# Patient Record
Sex: Female | Born: 1965 | Race: White | Hispanic: No | Marital: Single | Smoking: Former smoker
Health system: Southern US, Community
[De-identification: ages and names within clinical notes are randomized; demographics above are authoritative.]

## PROBLEM LIST (undated history)

## (undated) HISTORY — PX: BUNIONECTOMY: SHX129

---

## 2004-11-02 ENCOUNTER — Ambulatory Visit: Payer: Self-pay | Admitting: Internal Medicine

## 2004-12-30 ENCOUNTER — Ambulatory Visit: Payer: Self-pay | Admitting: Internal Medicine

## 2005-06-24 ENCOUNTER — Ambulatory Visit: Payer: Self-pay | Admitting: Internal Medicine

## 2006-03-21 ENCOUNTER — Ambulatory Visit: Payer: Self-pay | Admitting: Family Medicine

## 2006-10-11 ENCOUNTER — Ambulatory Visit: Payer: Self-pay | Admitting: Internal Medicine

## 2007-06-27 ENCOUNTER — Ambulatory Visit: Payer: Self-pay | Admitting: Internal Medicine

## 2007-06-27 DIAGNOSIS — L509 Urticaria, unspecified: Secondary | ICD-10-CM | POA: Insufficient documentation

## 2007-06-27 DIAGNOSIS — J029 Acute pharyngitis, unspecified: Secondary | ICD-10-CM | POA: Insufficient documentation

## 2007-06-27 LAB — CONVERTED CEMR LAB: Rapid Strep: NEGATIVE

## 2007-07-04 ENCOUNTER — Ambulatory Visit: Payer: Self-pay | Admitting: Internal Medicine

## 2007-09-25 ENCOUNTER — Ambulatory Visit: Payer: Self-pay | Admitting: Internal Medicine

## 2007-09-25 LAB — CONVERTED CEMR LAB
AST: 19 units/L (ref 0–37)
Bilirubin, Direct: 0.2 mg/dL (ref 0.0–0.3)
CO2: 28 meq/L (ref 19–32)
Chloride: 107 meq/L (ref 96–112)
Creatinine, Ser: 0.9 mg/dL (ref 0.4–1.2)
Eosinophils Absolute: 0.1 10*3/uL (ref 0.0–0.6)
Eosinophils Relative: 1.6 % (ref 0.0–5.0)
GFR calc non Af Amer: 74 mL/min
Glucose, Bld: 87 mg/dL (ref 70–99)
Glucose, Urine, Semiquant: NEGATIVE
HCT: 38.5 % (ref 36.0–46.0)
Lymphocytes Relative: 33.9 % (ref 12.0–46.0)
MCV: 94.7 fL (ref 78.0–100.0)
Neutrophils Relative %: 55.9 % (ref 43.0–77.0)
RBC: 4.07 M/uL (ref 3.87–5.11)
Sodium: 140 meq/L (ref 135–145)
Specific Gravity, Urine: 1.025
TSH: 1.34 microintl units/mL (ref 0.35–5.50)
Total Bilirubin: 0.7 mg/dL (ref 0.3–1.2)
Total CHOL/HDL Ratio: 2.8
Total Protein: 6.2 g/dL (ref 6.0–8.3)
WBC Urine, dipstick: NEGATIVE
WBC: 5.4 10*3/uL (ref 4.5–10.5)

## 2007-10-02 ENCOUNTER — Ambulatory Visit: Payer: Self-pay | Admitting: Internal Medicine

## 2007-10-02 DIAGNOSIS — K279 Peptic ulcer, site unspecified, unspecified as acute or chronic, without hemorrhage or perforation: Secondary | ICD-10-CM | POA: Insufficient documentation

## 2007-12-11 ENCOUNTER — Encounter: Admission: RE | Admit: 2007-12-11 | Discharge: 2007-12-11 | Payer: Self-pay | Admitting: Obstetrics and Gynecology

## 2007-12-20 ENCOUNTER — Encounter: Admission: RE | Admit: 2007-12-20 | Discharge: 2007-12-20 | Payer: Self-pay | Admitting: Obstetrics and Gynecology

## 2008-12-31 ENCOUNTER — Encounter: Payer: Self-pay | Admitting: Internal Medicine

## 2009-01-30 ENCOUNTER — Ambulatory Visit: Payer: Self-pay | Admitting: Internal Medicine

## 2009-01-30 DIAGNOSIS — J069 Acute upper respiratory infection, unspecified: Secondary | ICD-10-CM | POA: Insufficient documentation

## 2010-07-13 ENCOUNTER — Encounter: Admission: RE | Admit: 2010-07-13 | Discharge: 2010-07-13 | Payer: Self-pay | Admitting: Obstetrics and Gynecology

## 2010-09-06 ENCOUNTER — Encounter: Payer: Self-pay | Admitting: Obstetrics and Gynecology

## 2011-06-28 ENCOUNTER — Other Ambulatory Visit: Payer: Self-pay | Admitting: Obstetrics and Gynecology

## 2011-06-28 DIAGNOSIS — Z1231 Encounter for screening mammogram for malignant neoplasm of breast: Secondary | ICD-10-CM

## 2011-07-23 ENCOUNTER — Ambulatory Visit
Admission: RE | Admit: 2011-07-23 | Discharge: 2011-07-23 | Disposition: A | Discharge status: Home / Self Care | Payer: BC Managed Care – PPO | Source: Ambulatory Visit | Attending: Obstetrics and Gynecology | Admitting: Obstetrics and Gynecology

## 2011-07-23 DIAGNOSIS — Z1231 Encounter for screening mammogram for malignant neoplasm of breast: Secondary | ICD-10-CM

## 2012-12-19 ENCOUNTER — Ambulatory Visit (INDEPENDENT_AMBULATORY_CARE_PROVIDER_SITE_OTHER): Payer: BC Managed Care – PPO | Admitting: Internal Medicine

## 2012-12-19 ENCOUNTER — Encounter: Payer: Self-pay | Admitting: Internal Medicine

## 2012-12-19 VITALS — BP 120/74 | HR 55 | Temp 97.8°F | Resp 18 | Wt 161.0 lb

## 2012-12-19 DIAGNOSIS — J069 Acute upper respiratory infection, unspecified: Secondary | ICD-10-CM

## 2012-12-19 DIAGNOSIS — J029 Acute pharyngitis, unspecified: Secondary | ICD-10-CM

## 2012-12-19 MED ORDER — HYDROCODONE-HOMATROPINE 5-1.5 MG/5ML PO SYRP: 5.0000 mL | ORAL_SOLUTION | Freq: Four times a day (QID) | ORAL | Status: AC | PRN

## 2012-12-19 NOTE — Progress Notes (Signed)
  Subjective:    Patient ID: Courtney Schultz, female    DOB: 09/17/1965, 47 y.o.   MRN: [id number]  HPI  47 year old patient who presents with a one-week history of sore throat chest congestion and cough. No fever;  denies any wheezing shortness of breath or significant sputum production.  She enjoys excellent health and takes no chronic medications.  History reviewed. No pertinent past medical history.  History   Social History  . Marital Status: Single    Spouse Name: N/A    Number of Children: N/A  . Years of Education: N/A   Occupational History  . Not on file.   Social History Main Topics  . Smoking status: Former Smoker    Types: Cigarettes  . Smokeless tobacco: Never Used  . Alcohol Use: 4.2 oz/week    7 Cans of beer per week  . Drug Use: No  . Sexually Active: Not on file   Other Topics Concern  . Not on file   Social History Narrative  . No narrative on file    History reviewed. No pertinent past surgical history.  No family history on file.  Allergies  Allergen Reactions  . Penicillins     No current outpatient prescriptions on file prior to visit.   No current facility-administered medications on file prior to visit.    BP 120/74  Pulse 55  Temp(Src) 97.8 F (36.6 C) (Oral)  Resp 18  Wt 161 lb (73.029 kg)  BMI 26 kg/m2  SpO2 98%  LMP 12/11/2012       Review of Systems  Constitutional: Negative.   HENT: Positive for congestion, sore throat, rhinorrhea and sinus pressure. Negative for hearing loss, dental problem and tinnitus.   Eyes: Negative for pain, discharge and visual disturbance.  Respiratory: Positive for cough. Negative for shortness of breath.   Cardiovascular: Negative for chest pain, palpitations and leg swelling.  Gastrointestinal: Negative for nausea, vomiting, abdominal pain, diarrhea, constipation, blood in stool and abdominal distention.  Genitourinary: Negative for dysuria, urgency, frequency, hematuria, flank pain,  vaginal bleeding, vaginal discharge, difficulty urinating, vaginal pain and pelvic pain.  Musculoskeletal: Negative for joint swelling, arthralgias and gait problem.  Skin: Negative for rash.  Neurological: Negative for dizziness, syncope, speech difficulty, weakness, numbness and headaches.  Hematological: Negative for adenopathy.  Psychiatric/Behavioral: Negative for behavioral problems, dysphoric mood and agitation. The patient is not nervous/anxious.        Objective:   Physical Exam  Constitutional: She is oriented to person, place, and time. She appears well-developed and well-nourished. No distress.  HENT:  Head: Normocephalic.  Right Ear: External ear normal.  Left Ear: External ear normal.  Erythema of the oropharynx  Eyes: Conjunctivae and EOM are normal. Pupils are equal, round, and reactive to light.  Neck: Normal range of motion. Neck supple. No thyromegaly present.  Cardiovascular: Normal rate, regular rhythm, normal heart sounds and intact distal pulses.   Pulmonary/Chest: Effort normal and breath sounds normal.  Abdominal: Soft. Bowel sounds are normal. She exhibits no mass. There is no tenderness.  Musculoskeletal: Normal range of motion.  Lymphadenopathy:    She has no cervical adenopathy.  Neurological: She is alert and oriented to person, place, and time.  Skin: Skin is warm and dry. No rash noted.  Psychiatric: She has a normal mood and affect. Her behavior is normal.          Assessment & Plan:   Viral URI with cough. We'll treat symptomatically

## 2012-12-19 NOTE — Patient Instructions (Signed)
Acute bronchitis symptoms for less than 10 days are generally not helped by antibiotics.  Take over-the-counter expectorants and cough medications such as  Mucinex DM.  Call if there is no improvement in 5 to 7 days or if he developed worsening cough, fever, or new symptoms, such as shortness of breath or chest pain.    

## 2013-01-30 ENCOUNTER — Encounter: Payer: Self-pay | Admitting: Internal Medicine

## 2013-01-30 ENCOUNTER — Ambulatory Visit (INDEPENDENT_AMBULATORY_CARE_PROVIDER_SITE_OTHER): Payer: BC Managed Care – PPO | Admitting: Internal Medicine

## 2013-01-30 VITALS — BP 102/60 | HR 59 | Temp 98.5°F | Resp 18 | Ht 66.5 in | Wt 162.0 lb

## 2013-01-30 DIAGNOSIS — Z0000 Encounter for general adult medical examination without abnormal findings: Secondary | ICD-10-CM

## 2013-01-30 DIAGNOSIS — F909 Attention-deficit hyperactivity disorder, unspecified type: Secondary | ICD-10-CM

## 2013-01-30 DIAGNOSIS — L509 Urticaria, unspecified: Secondary | ICD-10-CM

## 2013-01-30 LAB — CBC WITH DIFFERENTIAL/PLATELET
Basophils Absolute: 0.1 10*3/uL (ref 0.0–0.1)
Eosinophils Absolute: 0.1 10*3/uL (ref 0.0–0.7)
Lymphocytes Relative: 34.3 % (ref 12.0–46.0)
MCHC: 33.6 g/dL (ref 30.0–36.0)
MCV: 97.2 fl (ref 78.0–100.0)
Monocytes Absolute: 0.4 10*3/uL (ref 0.1–1.0)
Neutro Abs: 3.4 10*3/uL (ref 1.4–7.7)
Neutrophils Relative %: 56.7 % (ref 43.0–77.0)
RDW: 13.6 % (ref 11.5–14.6)

## 2013-01-30 MED ORDER — AMPHETAMINE-DEXTROAMPHET ER 20 MG PO CP24: 20.0000 mg | ORAL_CAPSULE | ORAL | Status: DC

## 2013-01-30 NOTE — Progress Notes (Signed)
Subjective:    Patient ID: Courtney Schultz, female    DOB: [birth year], 47 y.o.   MRN: [id number]  HPI  47 year old patient who is seen today for a wellness exam. She has a history of beef allergy and has seen allergy in the past.  Beef products have triggered urticaria. No concerns or complaints today. No chronic medications  She does have a history of suspected ADHD but has resisted treatment in the past. She states that she feels very distracted and has a difficult time at work and wishes to consider a trial of medication at this time. She has read the book "driven to distraction" and after completion feels she clearly has ADHD. History reviewed. No pertinent past medical history.  History   Social History  . Marital Status: Single    Spouse Name: N/A    Number of Children: N/A  . Years of Education: N/A   Occupational History  . Not on file.   Social History Main Topics  . Smoking status: Former Smoker    Types: Cigarettes  . Smokeless tobacco: Never Used  . Alcohol Use: 4.2 oz/week    7 Cans of beer per week  . Drug Use: No  . Sexually Active: Not on file   Other Topics Concern  . Not on file   Social History Narrative  . No narrative on file    History reviewed. No pertinent past surgical history.  No family history on file.  Allergies  Allergen Reactions  . Penicillins     No current outpatient prescriptions on file prior to visit.   No current facility-administered medications on file prior to visit.    BP 102/60  Pulse 59  Temp(Src) 98.5 F (36.9 C) (Oral)  Resp 18  Ht 5' 6.5" (1.689 m)  Wt 162 lb (73.483 kg)  BMI 25.76 kg/m2  SpO2 99%  LMP 01/08/2013       Review of Systems  Constitutional: Negative.   HENT: Negative for hearing loss, congestion, sore throat, rhinorrhea, dental problem, sinus pressure and tinnitus.   Eyes: Negative for pain, discharge and visual disturbance.  Respiratory: Negative for cough and shortness of breath.    Cardiovascular: Negative for chest pain, palpitations and leg swelling.  Gastrointestinal: Negative for nausea, vomiting, abdominal pain, diarrhea, constipation, blood in stool and abdominal distention.  Genitourinary: Negative for dysuria, urgency, frequency, hematuria, flank pain, vaginal bleeding, vaginal discharge, difficulty urinating, vaginal pain and pelvic pain.  Musculoskeletal: Negative for joint swelling, arthralgias and gait problem.  Skin: Negative for rash.  Neurological: Negative for dizziness, syncope, speech difficulty, weakness, numbness and headaches.  Hematological: Negative for adenopathy.  Psychiatric/Behavioral: Negative for behavioral problems, dysphoric mood and agitation. The patient is not nervous/anxious.        Objective:   Physical Exam  Constitutional: She is oriented to person, place, and time. She appears well-developed and well-nourished.  HENT:  Head: Normocephalic.  Right Ear: External ear normal.  Left Ear: External ear normal.  Mouth/Throat: Oropharynx is clear and moist.  Eyes: Conjunctivae and EOM are normal. Pupils are equal, round, and reactive to light.  Neck: Normal range of motion. Neck supple. No thyromegaly present.  Cardiovascular: Normal rate, regular rhythm, normal heart sounds and intact distal pulses.   Pulmonary/Chest: Effort normal and breath sounds normal.  Abdominal: Soft. Bowel sounds are normal. She exhibits no mass. There is no tenderness.  Musculoskeletal: Normal range of motion.  Lymphadenopathy:    She has no cervical adenopathy.  Neurological:  She is alert and oriented to person, place, and time.  Skin: Skin is warm and dry. No rash noted.  Psychiatric: She has a normal mood and affect. Her behavior is normal.          Assessment & Plan:   Preventive health examination History of beef allergy(positive for galactosidase  Antibody)  Suspected ADHD. We'll give a 30 day  trial of Adderall 20 mg extended release

## 2013-01-30 NOTE — Patient Instructions (Addendum)
It is important that you exercise regularly, at least 20 minutes 3 to 4 times per week.  If you develop chest pain or shortness of breath seek  medical attention.  Call or return to clinic  as neededHealth Maintenance, Females A healthy lifestyle and preventative care can promote health and wellness.  Maintain regular health, dental, and eye exams.  Eat a healthy diet. Foods like vegetables, fruits, whole grains, low-fat dairy products, and lean protein foods contain the nutrients you need without too many calories. Decrease your intake of foods high in solid fats, added sugars, and salt. Get information about a proper diet from your caregiver, if necessary.  Regular physical exercise is one of the most important things you can do for your health. Most adults should get at least 150 minutes of moderate-intensity exercise (any activity that increases your heart rate and causes you to sweat) each week. In addition, most adults need muscle-strengthening exercises on 2 or more days a week.   Maintain a healthy weight. The body mass index (BMI) is a screening tool to identify possible weight problems. It provides an estimate of body fat based on height and weight. Your caregiver can help determine your BMI, and can help you achieve or maintain a healthy weight. For adults 20 years and older:  A BMI below 18.5 is considered underweight.  A BMI of 18.5 to 24.9 is normal.  A BMI of 25 to 29.9 is considered overweight.  A BMI of 30 and above is considered obese.  Maintain normal blood lipids and cholesterol by exercising and minimizing your intake of saturated fat. Eat a balanced diet with plenty of fruits and vegetables. Blood tests for lipids and cholesterol should begin at age 61 and be repeated every 5 years. If your lipid or cholesterol levels are high, you are over 50, or you are a high risk for heart disease, you may need your cholesterol levels checked more frequently.Ongoing high lipid and  cholesterol levels should be treated with medicines if diet and exercise are not effective.  If you smoke, find out from your caregiver how to quit. If you do not use tobacco, do not start.  If you are pregnant, do not drink alcohol. If you are breastfeeding, be very cautious about drinking alcohol. If you are not pregnant and choose to drink alcohol, do not exceed 1 drink per day. One drink is considered to be 12 ounces (355 mL) of beer, 5 ounces (148 mL) of wine, or 1.5 ounces (44 mL) of liquor.  Avoid use of street drugs. Do not share needles with anyone. Ask for help if you need support or instructions about stopping the use of drugs.  High blood pressure causes heart disease and increases the risk of stroke. Blood pressure should be checked at least every 1 to 2 years. Ongoing high blood pressure should be treated with medicines, if weight loss and exercise are not effective.  If you are 82 to 46 years old, ask your caregiver if you should take aspirin to prevent strokes.  Diabetes screening involves taking a blood sample to check your fasting blood sugar level. This should be done once every 3 years, after age 56, if you are within normal weight and without risk factors for diabetes. Testing should be considered at a younger age or be carried out more frequently if you are overweight and have at least 1 risk factor for diabetes.  Breast cancer screening is essential preventative care for women. You should practice "  breast self-awareness." This means understanding the normal appearance and feel of your breasts and may include breast self-examination. Any changes detected, no matter how small, should be reported to a caregiver. Women in their 52s and 30s should have a clinical breast exam (CBE) by a caregiver as part of a regular health exam every 1 to 3 years. After age 1, women should have a CBE every year. Starting at age 55, women should consider having a mammogram (breast X-ray) every year.  Women who have a family history of breast cancer should talk to their caregiver about genetic screening. Women at a high risk of breast cancer should talk to their caregiver about having an MRI and a mammogram every year.  The Pap test is a screening test for cervical cancer. Women should have a Pap test starting at age 97. Between ages 6 and 16, Pap tests should be repeated every 2 years. Beginning at age 66, you should have a Pap test every 3 years as long as the past 3 Pap tests have been normal. If you had a hysterectomy for a problem that was not cancer or a condition that could lead to cancer, then you no longer need Pap tests. If you are between ages 42 and 17, and you have had normal Pap tests going back 10 years, you no longer need Pap tests. If you have had past treatment for cervical cancer or a condition that could lead to cancer, you need Pap tests and screening for cancer for at least 20 years after your treatment. If Pap tests have been discontinued, risk factors (such as a new sexual partner) need to be reassessed to determine if screening should be resumed. Some women have medical problems that increase the chance of getting cervical cancer. In these cases, your caregiver may recommend more frequent screening and Pap tests.  The human papillomavirus (HPV) test is an additional test that may be used for cervical cancer screening. The HPV test looks for the virus that can cause the cell changes on the cervix. The cells collected during the Pap test can be tested for HPV. The HPV test could be used to screen women aged 22 years and older, and should be used in women of any age who have unclear Pap test results. After the age of 72, women should have HPV testing at the same frequency as a Pap test.  Colorectal cancer can be detected and often prevented. Most routine colorectal cancer screening begins at the age of 49 and continues through age 37. However, your caregiver may recommend screening at  an earlier age if you have risk factors for colon cancer. On a yearly basis, your caregiver may provide home test kits to check for hidden blood in the stool. Use of a small camera at the end of a tube, to directly examine the colon (sigmoidoscopy or colonoscopy), can detect the earliest forms of colorectal cancer. Talk to your caregiver about this at age 40, when routine screening begins. Direct examination of the colon should be repeated every 5 to 10 years through age 63, unless early forms of pre-cancerous polyps or small growths are found.  Hepatitis C blood testing is recommended for all people born from [birth year] through 1964 and any individual with known risks for hepatitis C.  Practice safe sex. Use condoms and avoid high-risk sexual practices to reduce the spread of sexually transmitted infections (STIs). Sexually active women aged 64 and younger should be checked for Chlamydia, which is a common  sexually transmitted infection. Older women with new or multiple partners should also be tested for Chlamydia. Testing for other STIs is recommended if you are sexually active and at increased risk.  Osteoporosis is a disease in which the bones lose minerals and strength with aging. This can result in serious bone fractures. The risk of osteoporosis can be identified using a bone density scan. Women ages 30 and over and women at risk for fractures or osteoporosis should discuss screening with their caregivers. Ask your caregiver whether you should be taking a calcium supplement or vitamin D to reduce the rate of osteoporosis.  Menopause can be associated with physical symptoms and risks. Hormone replacement therapy is available to decrease symptoms and risks. You should talk to your caregiver about whether hormone replacement therapy is right for you.  Use sunscreen with a sun protection factor (SPF) of 30 or greater. Apply sunscreen liberally and repeatedly throughout the day. You should seek shade when your  shadow is shorter than you. Protect yourself by wearing long sleeves, pants, a wide-brimmed hat, and sunglasses year round, whenever you are outdoors.  Notify your caregiver of new moles or changes in moles, especially if there is a change in shape or color. Also notify your caregiver if a mole is larger than the size of a pencil eraser.  Stay current with your immunizations. Document Released: 02/15/2011 Document Revised: 10/25/2011 Document Reviewed: 02/15/2011 [facility] Patient Information 2014 [place], [place].

## 2013-01-31 LAB — LIPID PANEL
LDL Cholesterol: 96 mg/dL (ref 0–99)
Total CHOL/HDL Ratio: 2

## 2013-01-31 LAB — COMPREHENSIVE METABOLIC PANEL
ALT: 17 U/L (ref 0–35)
AST: 24 U/L (ref 0–37)
Albumin: 3.8 g/dL (ref 3.5–5.2)
Alkaline Phosphatase: 42 U/L (ref 39–117)
Calcium: 9.3 mg/dL (ref 8.4–10.5)
Chloride: 109 mEq/L (ref 96–112)
Potassium: 4.7 mEq/L (ref 3.5–5.1)
Sodium: 140 mEq/L (ref 135–145)

## 2013-03-05 ENCOUNTER — Other Ambulatory Visit: Payer: Self-pay | Admitting: Internal Medicine

## 2013-03-05 NOTE — Telephone Encounter (Signed)
Left message on voicemail to call office.  

## 2013-03-09 NOTE — Telephone Encounter (Signed)
Spoke to pt told her I can give her Rx for Adderall but she has to pick it up here at the office I can not send to pharmacy. Pt verbalized understanding and stated she can come by on Monday. Told pt okay will have ready on Monday.

## 2013-03-12 MED ORDER — AMPHETAMINE-DEXTROAMPHET ER 20 MG PO CP24: 20.0000 mg | ORAL_CAPSULE | ORAL | Status: DC

## 2013-03-12 NOTE — Telephone Encounter (Signed)
Rx's for Adderall printed and signed, put at front desk for pick up.

## 2013-06-21 ENCOUNTER — Other Ambulatory Visit: Payer: Self-pay

## 2013-08-06 ENCOUNTER — Other Ambulatory Visit: Payer: Self-pay | Admitting: Internal Medicine

## 2013-08-14 MED ORDER — AMPHETAMINE-DEXTROAMPHET ER 20 MG PO CP24: 20.0000 mg | ORAL_CAPSULE | ORAL | Status: DC

## 2013-08-14 NOTE — Telephone Encounter (Signed)
Left detailed message Rx's ready for pickup. Rx's printed and signed put at front desk.

## 2014-01-15 ENCOUNTER — Other Ambulatory Visit (HOSPITAL_COMMUNITY)
Admission: RE | Admit: 2014-01-15 | Discharge: 2014-01-15 | Disposition: A | Discharge status: Home / Self Care | Payer: BC Managed Care – PPO | Source: Ambulatory Visit | Attending: Nurse Practitioner | Admitting: Nurse Practitioner

## 2014-01-15 ENCOUNTER — Other Ambulatory Visit: Payer: Self-pay | Admitting: Nurse Practitioner

## 2014-01-15 DIAGNOSIS — Z1151 Encounter for screening for human papillomavirus (HPV): Secondary | ICD-10-CM | POA: Insufficient documentation

## 2014-01-15 DIAGNOSIS — Z124 Encounter for screening for malignant neoplasm of cervix: Secondary | ICD-10-CM | POA: Insufficient documentation

## 2014-01-18 LAB — CYTOLOGY - PAP

## 2014-08-18 ENCOUNTER — Telehealth: Payer: Self-pay | Admitting: Internal Medicine

## 2014-08-19 NOTE — Telephone Encounter (Signed)
Left message on voicemail to call office.  

## 2014-08-19 NOTE — Telephone Encounter (Signed)
PLEASE NOTE: All timestamps contained within this report are represented as [place] Standard Time. CONFIDENTIALTY NOTICE: This fax transmission is intended only for the addressee. It contains information that is legally privileged, confidential or otherwise protected from use or disclosure. If you are not the intended recipient, you are strictly prohibited from reviewing, disclosing, copying using or disseminating any of this information or taking any action in reliance on or regarding this information. If you have received this fax in error, please notify us immediately by telephone so that we can arrange for its return to [place]. Phone: [phone], Toll-Free: [phone], Fax: [phone] Page: 1 of 2 Call Id: [id number] [facility] - Client [place] Patient Name: Courtney Schultz Gender: Female DOB: 01-26-1966 Age: 49 Y 6 D Return Phone Number: [phone] (Primary) Address: City/State/Zip: [place] Client [facility] Client [facility] Night Physician Simonne Martinet Contact Type Call Call Type Triage / Clinical Relationship To Patient Self Return Phone Number [phone] (Primary) Chief Complaint Leg Swelling And Edema Initial Comment Caller states she has swelling and a knot in her leg. [place] Not Listed [facility] PreDisposition Home Care Nurse Assessment Nurse: Joya Salm, RN, Sharyn Lull Date/Time (Eastern Time): 08/18/2014 3:46:27 PM Confirm and document reason for call. If symptomatic, describe symptoms. ---Caller states she has swelling and a knot in her leg - LEFT . States that she has had a knot in the back of her calf and recently has flown to [place] and the area has gotten bigger. She is also some shooting pain in the back of leg. Left leg was more swollen than the right after the flight, swelling has since gone down. HX of  DVT Has the patient traveled out of the country within the last 30 days? ---Yes Where have you traveled? ([place] for [place] and Ebola guideline, [place], [place] for [place]) ---[place] Does the patient require triage? ---Yes Related visit to physician within the last 2 weeks? ---No Does the PT have any chronic conditions? (i.e. diabetes, asthma, etc.) ---No Did the patient indicate they were pregnant? ---No Guidelines Guideline Title Affirmed Question Affirmed Notes Nurse Date/Time Eilene Ghazi Time) Leg Pain [1] Thigh or calf pain AND [2] only 1 side AND [3] present > 1 hour Joya Salm, RN, Sharyn Lull 08/18/2014 3:51:32 PM Disp. Time Eilene Ghazi Time) Disposition Final User 08/18/2014 3:55:28 PM See Physician within 4 Hours (or PCP triage) Yes Joya Salm, RN, Sharyn Lull PLEASE NOTE: All timestamps contained within this report are represented as [place] Standard Time. CONFIDENTIALTY NOTICE: This fax transmission is intended only for the addressee. It contains information that is legally privileged, confidential or otherwise protected from use or disclosure. If you are not the intended recipient, you are strictly prohibited from reviewing, disclosing, copying using or disseminating any of this information or taking any action in reliance on or regarding this information. If you have received this fax in error, please notify us immediately by telephone so that we can arrange for its return to [place]. Phone: [phone], Toll-Free: [phone], Fax: [phone] Page: 2 of 2 Call Id: [id number] Caller Understands: Yes Disagree/Comply: Comply Care Advice Given Per Guideline * IF NO PCP TRIAGE: You need to be seen. Go to _______________ (ED/[facility] or office if it will be open) within the next 3 or 4 hours. Go sooner if you become worse. SEE PHYSICIAN WITHIN 4 HOURS (or PCP triage): CALL EMS IF: You develop any chest pain  or shortness of breath. CARE ADVICE given per Leg Pain (Adult) guideline. After  Care Instructions Given Call Event Type User Date / Time Description Referrals GO TO FACILITY OTHER - SPECIFY

## 2014-08-20 NOTE — Telephone Encounter (Signed)
Left message on voicemail to call office.  

## 2014-08-21 ENCOUNTER — Ambulatory Visit: Payer: Self-pay | Admitting: *Deleted

## 2014-08-22 NOTE — Telephone Encounter (Signed)
Spoke to pt, asked her if she went to ED or Urgent care regarding knot in leg? Pt stated yes, she went to [facility] and everything was fine. Told pt okay, just wanted to make sure she was okay.

## 2015-10-17 ENCOUNTER — Ambulatory Visit (INDEPENDENT_AMBULATORY_CARE_PROVIDER_SITE_OTHER): Payer: Managed Care, Other (non HMO) | Admitting: Internal Medicine

## 2015-10-17 ENCOUNTER — Encounter: Payer: Self-pay | Admitting: Internal Medicine

## 2015-10-17 VITALS — BP 130/80 | HR 66 | Temp 98.3°F | Wt 156.0 lb

## 2015-10-17 DIAGNOSIS — F900 Attention-deficit hyperactivity disorder, predominantly inattentive type: Secondary | ICD-10-CM

## 2015-10-17 DIAGNOSIS — F329 Major depressive disorder, single episode, unspecified: Secondary | ICD-10-CM | POA: Diagnosis not present

## 2015-10-17 DIAGNOSIS — F419 Anxiety disorder, unspecified: Secondary | ICD-10-CM | POA: Insufficient documentation

## 2015-10-17 DIAGNOSIS — F32A Depression, unspecified: Secondary | ICD-10-CM

## 2015-10-17 MED ORDER — VENLAFAXINE HCL ER 75 MG PO CP24: 75.0000 mg | ORAL_CAPSULE | Freq: Every day | ORAL | Status: DC

## 2015-10-17 MED ORDER — VENLAFAXINE HCL ER 37.5 MG PO CP24: 37.5000 mg | ORAL_CAPSULE | Freq: Every day | ORAL | Status: DC

## 2015-10-17 NOTE — Patient Instructions (Signed)
Effexor 37.5 milligrams daily for 2 weeks, then increase dose to 75 mg daily Return in 2 months for follow-up  Call or return to clinic prn if these symptoms worsen or fail to improve as anticipated.

## 2015-10-17 NOTE — Progress Notes (Signed)
Pre visit review using our clinic review tool, if applicable. No additional management support is needed unless otherwise documented below in the visit note. 

## 2015-10-17 NOTE — Progress Notes (Signed)
   Subjective:    Patient ID: Courtney Schultz, female    DOB: 29-Aug-1965, 50 y.o.   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  Symptoms usually resolve but she has now been depressed  for the past 9 months. 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  In spite of a history of ADD and being off Adderall.  She has a 4-point average Adderall was quite helpful for her symptoms but was too stimulatory  and she self discontinued  No past medical history on file.  Social History   Social History  . Marital Status: Single    Spouse Name: N/A  . Number of Children: N/A  . Years of Education: N/A   Occupational History  . Not on file.   Social History Main Topics  . Smoking status: Former Smoker    Types: Cigarettes  . Smokeless tobacco: Never Used  . Alcohol Use: 4.2 oz/week    7 Cans of beer per week  . Drug Use: No  . Sexual Activity: Not on file   Other Topics Concern  . Not on file   Social History Narrative    No past surgical history on file.  No family history on file.  Allergies  Allergen Reactions  . Penicillins     No current outpatient prescriptions on file prior to visit.   No current facility-administered medications on file prior to visit.    BP 130/80 mmHg  Pulse 66  Temp(Src) 98.3 F (36.8 C) (Oral)  Wt 156 lb (70.761 kg)    Review of Systems  Psychiatric/Behavioral: Positive for dysphoric mood and decreased concentration.       Objective:   Physical Exam  Constitutional: She appears well-developed and well-nourished. No distress.  Psychiatric: She has a normal mood and affect. Her behavior is normal. Judgment and thought content normal.          Assessment & Plan:   Depression.  Will place on Effexor and titrate the dose after 2 weeks.  Recheck 6 weeks ADD.  Will observe off medication at this time

## 2015-12-19 ENCOUNTER — Encounter: Payer: Self-pay | Admitting: Internal Medicine

## 2015-12-19 ENCOUNTER — Ambulatory Visit (INDEPENDENT_AMBULATORY_CARE_PROVIDER_SITE_OTHER): Payer: Managed Care, Other (non HMO) | Admitting: Internal Medicine

## 2015-12-19 VITALS — BP 110/70 | HR 75 | Temp 98.1°F | Resp 20 | Ht 66.5 in | Wt 153.0 lb

## 2015-12-19 DIAGNOSIS — F900 Attention-deficit hyperactivity disorder, predominantly inattentive type: Secondary | ICD-10-CM

## 2015-12-19 DIAGNOSIS — F329 Major depressive disorder, single episode, unspecified: Secondary | ICD-10-CM

## 2015-12-19 DIAGNOSIS — F32A Depression, unspecified: Secondary | ICD-10-CM

## 2015-12-19 MED ORDER — VENLAFAXINE HCL ER 75 MG PO CP24: 75.0000 mg | ORAL_CAPSULE | Freq: Every day | ORAL | Status: DC

## 2015-12-19 NOTE — Progress Notes (Signed)
   Subjective:    Patient ID: Courtney Schultz, female    DOB: 1965-09-27, 50 y.o.   MRN: [id number]  HPI 50 year old patient who is seen today for follow-up of depression.  She was placed on Effexor about 2 months ago with a final dose of 75 mg daily.  She has done quite well on this dose and feels quite well. She is followed by a counselor She is scheduled to graduate from school in about 1 week and does have a job lined up.  No past medical history on file.   Social History   Social History  . Marital Status: Single    Spouse Name: N/A  . Number of Children: N/A  . Years of Education: N/A   Occupational History  . Not on file.   Social History Main Topics  . Smoking status: Former Smoker    Types: Cigarettes  . Smokeless tobacco: Never Used  . Alcohol Use: 4.2 oz/week    7 Cans of beer per week  . Drug Use: No  . Sexual Activity: Not on file   Other Topics Concern  . Not on file   Social History Narrative    No past surgical history on file.  No family history on file.  Allergies  Allergen Reactions  . Penicillins     No current outpatient prescriptions on file prior to visit.   No current facility-administered medications on file prior to visit.    BP 110/70 mmHg  Pulse 75  Temp(Src) 98.1 F (36.7 C) (Oral)  Resp 20  Ht 5' 6.5" (1.689 m)  Wt 153 lb (69.4 kg)  BMI 24.33 kg/m2  SpO2 99%      Review of Systems  Constitutional: Negative.   HENT: Negative for congestion, dental problem, hearing loss, rhinorrhea, sinus pressure, sore throat and tinnitus.   Eyes: Negative for pain, discharge and visual disturbance.  Respiratory: Negative for cough and shortness of breath.   Cardiovascular: Negative for chest pain, palpitations and leg swelling.  Gastrointestinal: Negative for nausea, vomiting, abdominal pain, diarrhea, constipation, blood in stool and abdominal distention.  Genitourinary: Negative for dysuria, urgency, frequency, hematuria, flank  pain, vaginal bleeding, vaginal discharge, difficulty urinating, vaginal pain and pelvic pain.  Musculoskeletal: Negative for joint swelling, arthralgias and gait problem.  Skin: Negative for rash.  Neurological: Negative for dizziness, syncope, speech difficulty, weakness, numbness and headaches.  Hematological: Negative for adenopathy.  Psychiatric/Behavioral: Positive for dysphoric mood and decreased concentration. Negative for behavioral problems and agitation. The patient is not nervous/anxious.        Objective:   Physical Exam  Constitutional: She appears well-developed and well-nourished. No distress.  Psychiatric: She has a normal mood and affect. Her behavior is normal. Judgment and thought content normal.          Assessment & Plan:   Depression, improved.  Patient feels that she is back to baseline and enjoying daily activities.  She continues to do well academically very bright affect today.  No change in regimen.  We'll recheck in 6 months.  Probably consider taper and discontinuation in the spring of next year

## 2015-12-19 NOTE — Patient Instructions (Signed)
Return in 6 months for follow up

## 2015-12-19 NOTE — Progress Notes (Signed)
Pre visit review using our clinic review tool, if applicable. No additional management support is needed unless otherwise documented below in the visit note. 

## 2016-02-25 ENCOUNTER — Other Ambulatory Visit: Payer: Self-pay | Admitting: Internal Medicine

## 2016-02-25 DIAGNOSIS — Z1231 Encounter for screening mammogram for malignant neoplasm of breast: Secondary | ICD-10-CM

## 2016-03-02 ENCOUNTER — Ambulatory Visit
Admission: RE | Admit: 2016-03-02 | Discharge: 2016-03-02 | Disposition: A | Discharge status: Home / Self Care | Payer: Managed Care, Other (non HMO) | Source: Ambulatory Visit | Attending: Internal Medicine | Admitting: Internal Medicine

## 2016-03-02 DIAGNOSIS — Z1231 Encounter for screening mammogram for malignant neoplasm of breast: Secondary | ICD-10-CM

## 2016-03-04 ENCOUNTER — Other Ambulatory Visit: Payer: Self-pay | Admitting: Internal Medicine

## 2016-03-04 DIAGNOSIS — R928 Other abnormal and inconclusive findings on diagnostic imaging of breast: Secondary | ICD-10-CM

## 2016-03-10 ENCOUNTER — Ambulatory Visit
Admission: RE | Admit: 2016-03-10 | Discharge: 2016-03-10 | Disposition: A | Discharge status: Home / Self Care | Payer: Managed Care, Other (non HMO) | Source: Ambulatory Visit | Attending: Internal Medicine | Admitting: Internal Medicine

## 2016-03-10 DIAGNOSIS — R928 Other abnormal and inconclusive findings on diagnostic imaging of breast: Secondary | ICD-10-CM

## 2016-05-12 ENCOUNTER — Encounter: Payer: Self-pay | Admitting: Internal Medicine

## 2016-05-12 ENCOUNTER — Ambulatory Visit (INDEPENDENT_AMBULATORY_CARE_PROVIDER_SITE_OTHER): Payer: Managed Care, Other (non HMO) | Admitting: Internal Medicine

## 2016-05-12 VITALS — BP 130/80 | HR 75 | Temp 97.9°F | Resp 20 | Ht 66.5 in | Wt 161.0 lb

## 2016-05-12 DIAGNOSIS — J029 Acute pharyngitis, unspecified: Secondary | ICD-10-CM

## 2016-05-12 DIAGNOSIS — J069 Acute upper respiratory infection, unspecified: Secondary | ICD-10-CM | POA: Diagnosis not present

## 2016-05-12 MED ORDER — HYDROCODONE-HOMATROPINE 5-1.5 MG/5ML PO SYRP: 5.0000 mL | ORAL_SOLUTION | Freq: Four times a day (QID) | ORAL | 0 refills | Status: DC | PRN

## 2016-05-12 NOTE — Patient Instructions (Signed)
Acute bronchitis symptoms for less than 10 days are generally not helped by antibiotics.  Take over-the-counter expectorants and cough medications such as  Mucinex DM.  Call if there is no improvement in 5 to 7 days or if  you develop worsening cough, fever, or new symptoms, such as shortness of breath or chest pain.   

## 2016-05-12 NOTE — Progress Notes (Signed)
Pre visit review using our clinic review tool, if applicable. No additional management support is needed unless otherwise documented below in the visit note. 

## 2016-05-12 NOTE — Progress Notes (Signed)
Subjective:    Patient ID: Courtney Schultz, female    DOB: 01-07-1966, 50 y.o.   MRN: [id number]  HPI  50 year old patient who presents with a six-day history of chest congestion, cough, fatigue and general sense of unwellness.  Symptoms began 2 days after completing an international flight from [place].  No fever or chills.  Cough is been minimally productive of yellow sputum.  No wheezing or shortness of breath.  No past medical history on file.   Social History   Social History  . Marital status: Single    Spouse name: N/A  . Number of children: N/A  . Years of education: N/A   Occupational History  . Not on file.   Social History Main Topics  . Smoking status: Former Smoker    Types: Cigarettes  . Smokeless tobacco: Never Used  . Alcohol use 4.2 oz/week    7 Cans of beer per week  . Drug use: No  . Sexual activity: Not on file   Other Topics Concern  . Not on file   Social History Narrative  . No narrative on file    No past surgical history on file.  No family history on file.  Allergies  Allergen Reactions  . Penicillins     Current Outpatient Prescriptions on File Prior to Visit  Medication Sig Dispense Refill  . venlafaxine XR (EFFEXOR XR) 75 MG 24 hr capsule Take 1 capsule (75 mg total) by mouth daily with breakfast. 90 capsule 2   No current facility-administered medications on file prior to visit.     BP 130/80 (BP Location: Left Arm, Patient Position: Sitting, Cuff Size: Normal)   Pulse 75   Temp 97.9 F (36.6 C) (Oral)   Resp 20   Ht 5' 6.5" (1.689 m)   Wt 161 lb (73 kg)   SpO2 98%   BMI 25.60 kg/m     Review of Systems  Constitutional: Positive for activity change, appetite change and fatigue.  HENT: Positive for congestion and sore throat. Negative for dental problem, hearing loss, rhinorrhea, sinus pressure and tinnitus.   Eyes: Negative for pain, discharge and visual disturbance.  Respiratory: Positive for cough. Negative for  shortness of breath and wheezing.   Cardiovascular: Negative for chest pain, palpitations and leg swelling.  Gastrointestinal: Negative for abdominal distention, abdominal pain, blood in stool, constipation, diarrhea, nausea and vomiting.  Genitourinary: Negative for difficulty urinating, dysuria, flank pain, frequency, hematuria, pelvic pain, urgency, vaginal bleeding, vaginal discharge and vaginal pain.  Musculoskeletal: Negative for arthralgias, gait problem and joint swelling.  Skin: Negative for rash.  Neurological: Negative for dizziness, syncope, speech difficulty, weakness, numbness and headaches.  Hematological: Negative for adenopathy.  Psychiatric/Behavioral: Negative for agitation, behavioral problems and dysphoric mood. The patient is not nervous/anxious.        Objective:   Physical Exam  Constitutional: She is oriented to person, place, and time. She appears well-developed and well-nourished. No distress.  Afebrile.  No distress.  O2 saturation 98    HENT:  Head: Normocephalic.  Right Ear: External ear normal.  Left Ear: External ear normal.  Mild erythema of the oropharynx  Eyes: Conjunctivae and EOM are normal. Pupils are equal, round, and reactive to light.  Neck: Normal range of motion. Neck supple. No thyromegaly present.  Cardiovascular: Normal rate, regular rhythm, normal heart sounds and intact distal pulses.   Pulmonary/Chest: Effort normal.  Few scattered coarse rhonchi, right greater than left  Abdominal: Soft. Bowel sounds  are normal. She exhibits no mass. There is no tenderness.  Musculoskeletal: Normal range of motion.  Lymphadenopathy:    She has no cervical adenopathy.  Neurological: She is alert and oriented to person, place, and time.  Skin: Skin is warm and dry. No rash noted.  Psychiatric: She has a normal mood and affect. Her behavior is normal.          Assessment & Plan:   Viral URI with cough Pharyngitis  Will treat  symptomatically Will report any new or worsening symptoms  Nyoka Cowden

## 2016-06-25 ENCOUNTER — Encounter: Payer: Self-pay | Admitting: Internal Medicine

## 2016-06-25 ENCOUNTER — Ambulatory Visit (INDEPENDENT_AMBULATORY_CARE_PROVIDER_SITE_OTHER): Payer: Managed Care, Other (non HMO) | Admitting: Internal Medicine

## 2016-06-25 VITALS — BP 126/70 | HR 71 | Temp 98.5°F | Resp 20 | Ht 66.5 in | Wt 164.5 lb

## 2016-06-25 DIAGNOSIS — F341 Dysthymic disorder: Secondary | ICD-10-CM | POA: Diagnosis not present

## 2016-06-25 DIAGNOSIS — F900 Attention-deficit hyperactivity disorder, predominantly inattentive type: Secondary | ICD-10-CM | POA: Diagnosis not present

## 2016-06-25 DIAGNOSIS — Z23 Encounter for immunization: Secondary | ICD-10-CM

## 2016-06-25 NOTE — Patient Instructions (Signed)
In  the Spring, decrease Effexor to   Every third day for 2 weeks, then Every other day for 2 weeks, then discontinue

## 2016-06-25 NOTE — Progress Notes (Signed)
Pre visit review using our clinic review tool, if applicable. No additional management support is needed unless otherwise documented below in the visit note. 

## 2016-06-25 NOTE — Progress Notes (Signed)
   Subjective:    Patient ID: Courtney Schultz, female    DOB: [birth year], 50 y.o.   MRN: [id number]  HPI  50 year old patient who has a history of depression as well as ADD.  She was placed on Effexor in the spring and has done remarkably well on her present dose of 75 mg once daily.  She had a very prompt response and  continues to do very well.  She is also seen some benefit in ADD symptoms.  No past medical history on file.   Social History   Social History  . Marital status: Single    Spouse name: N/A  . Number of children: N/A  . Years of education: N/A   Occupational History  . Not on file.   Social History Main Topics  . Smoking status: Former Smoker    Types: Cigarettes  . Smokeless tobacco: Never Used  . Alcohol use 4.2 oz/week    7 Cans of beer per week  . Drug use: No  . Sexual activity: Not on file   Other Topics Concern  . Not on file   Social History Narrative  . No narrative on file    No past surgical history on file.  No family history on file.  Allergies  Allergen Reactions  . Penicillins     Current Outpatient Prescriptions on File Prior to Visit  Medication Sig Dispense Refill  . venlafaxine XR (EFFEXOR XR) 75 MG 24 hr capsule Take 1 capsule (75 mg total) by mouth daily with breakfast. 90 capsule 2   No current facility-administered medications on file prior to visit.     BP 126/70 (BP Location: Left Arm, Patient Position: Sitting, Cuff Size: Normal)   Pulse 71   Temp 98.5 F (36.9 C) (Oral)   Resp 20   Ht 5' 6.5" (1.689 m)   Wt 164 lb 8 oz (74.6 kg)   SpO2 98%   BMI 26.15 kg/m     Review of Systems  Constitutional: Negative.   Psychiatric/Behavioral: Negative.        Objective:   Physical Exam  Constitutional: She appears well-developed and well-nourished. She appears distressed.  Psychiatric: She has a normal mood and affect. Her behavior is normal. Judgment and thought content normal.          Assessment & Plan:     Clinical depression.  Nice clinical response on present regimen.  The patient will taper and discontinue Effexor in the spring  Follow-up in 2-3 months for her annual exam  Nyoka Cowden

## 2016-08-13 ENCOUNTER — Other Ambulatory Visit: Payer: Self-pay

## 2016-08-20 ENCOUNTER — Encounter: Payer: Managed Care, Other (non HMO) | Admitting: Internal Medicine

## 2016-10-04 ENCOUNTER — Other Ambulatory Visit: Payer: Self-pay | Admitting: Internal Medicine

## 2016-11-05 DIAGNOSIS — R51 Headache: Secondary | ICD-10-CM | POA: Diagnosis not present

## 2016-11-05 DIAGNOSIS — M9901 Segmental and somatic dysfunction of cervical region: Secondary | ICD-10-CM | POA: Diagnosis not present

## 2016-11-05 DIAGNOSIS — M9902 Segmental and somatic dysfunction of thoracic region: Secondary | ICD-10-CM | POA: Diagnosis not present

## 2016-11-05 DIAGNOSIS — M791 Myalgia: Secondary | ICD-10-CM | POA: Diagnosis not present

## 2016-11-19 DIAGNOSIS — F4322 Adjustment disorder with anxiety: Secondary | ICD-10-CM | POA: Diagnosis not present

## 2016-12-16 DIAGNOSIS — M9902 Segmental and somatic dysfunction of thoracic region: Secondary | ICD-10-CM | POA: Diagnosis not present

## 2016-12-16 DIAGNOSIS — M9901 Segmental and somatic dysfunction of cervical region: Secondary | ICD-10-CM | POA: Diagnosis not present

## 2016-12-16 DIAGNOSIS — M791 Myalgia: Secondary | ICD-10-CM | POA: Diagnosis not present

## 2016-12-16 DIAGNOSIS — R51 Headache: Secondary | ICD-10-CM | POA: Diagnosis not present

## 2017-03-24 ENCOUNTER — Encounter: Payer: Self-pay | Admitting: Podiatry

## 2017-03-24 ENCOUNTER — Ambulatory Visit (INDEPENDENT_AMBULATORY_CARE_PROVIDER_SITE_OTHER): Payer: BLUE CROSS/BLUE SHIELD

## 2017-03-24 ENCOUNTER — Ambulatory Visit (INDEPENDENT_AMBULATORY_CARE_PROVIDER_SITE_OTHER): Payer: BLUE CROSS/BLUE SHIELD | Admitting: Podiatry

## 2017-03-24 VITALS — BP 119/74 | HR 60

## 2017-03-24 DIAGNOSIS — S90129A Contusion of unspecified lesser toe(s) without damage to nail, initial encounter: Secondary | ICD-10-CM

## 2017-03-24 DIAGNOSIS — S99929A Unspecified injury of unspecified foot, initial encounter: Secondary | ICD-10-CM

## 2017-03-24 NOTE — Progress Notes (Signed)
   Subjective:    Patient ID: Courtney Schultz, female    DOB: 1965-11-15, 51 y.o.   MRN: [id number]  HPI    Review of Systems  All other systems reviewed and are negative.      Objective:   Physical Exam        Assessment & Plan:

## 2017-03-24 NOTE — Progress Notes (Signed)
   Subjective:    Patient ID: Courtney Schultz, female    DOB: Jan 15, 1966, 51 y.o.   MRN: [id number]  HPI this patient presents the office with chief complaint of a painful fifth toe of the right foot. She states that 3 weeks ago she jammed her fifth toe and injured the toe.  One week later. She dropped a saw on her same fifth toe.  She says that her toe has been continually painful and she is unable to bear weight through the toe.  She says she also cannot put on shoes. She presents the office today for an x-ray and evaluation of her fifth toe.  She does state she has a past history of an injury to the fifth toe years ago.      Review of Systems  All other systems reviewed and are negative.      Objective:   Physical Exam GENERAL APPEARANCE: Alert, conversant. Appropriately groomed. No acute distress.  VASCULAR: Pedal pulses are  palpable at  [facility] and PT bilateral.  Capillary refill time is immediate to all digits,  Normal temperature gradient.  Digital hair growth is present bilateral  NEUROLOGIC: sensation is normal to 5.07 monofilament at 5/5 sites bilateral.  Light touch is intact bilateral, Muscle strength normal.  MUSCULOSKELETAL: acceptable muscle strength, tone and stability bilateral.  Intrinsic muscluature intact bilateral.  Rectus appearance of foot and digits noted bilateral. (previous bunion surgery).  Patient has a swollen, painful fifth toe right foot.    DERMATOLOGIC: skin color, texture, and turgor are within normal limits.  No preulcerative lesions or ulcers  are seen, no interdigital maceration noted.  No open lesions present.  Digital nails are asymptomatic. No drainage noted.         Assessment & Plan:  Contusion/Closed fracture fifth toe right foot.  ROV  x-ray was taken and it reveals bony reactivity noted at the distal aspect of the fifth toe.  The DIPJ is obliterated and there appears to be cystic bone at the level of the DIPJ.  No displacement of a fracture is  noted, but I believe there is a fracture present.  There is a K wire present in the first metatarsal from previous surgery.  Patient was treated with buddy taping of the toe.  She is to return to the office when necessary   Gardiner Barefoot DPM

## 2017-04-13 ENCOUNTER — Other Ambulatory Visit: Payer: Self-pay | Admitting: Internal Medicine

## 2017-05-02 ENCOUNTER — Other Ambulatory Visit: Payer: Self-pay | Admitting: Internal Medicine

## 2017-05-02 ENCOUNTER — Encounter: Payer: Self-pay | Admitting: Internal Medicine

## 2017-05-02 MED ORDER — VENLAFAXINE HCL ER 150 MG PO CP24: 150.0000 mg | ORAL_CAPSULE | Freq: Every day | ORAL | 3 refills | Status: DC

## 2017-05-05 ENCOUNTER — Encounter: Payer: Self-pay | Admitting: Internal Medicine

## 2017-06-16 DIAGNOSIS — M9901 Segmental and somatic dysfunction of cervical region: Secondary | ICD-10-CM | POA: Diagnosis not present

## 2017-06-16 DIAGNOSIS — R51 Headache: Secondary | ICD-10-CM | POA: Diagnosis not present

## 2017-06-16 DIAGNOSIS — M791 Myalgia, unspecified site: Secondary | ICD-10-CM | POA: Diagnosis not present

## 2017-06-16 DIAGNOSIS — M9902 Segmental and somatic dysfunction of thoracic region: Secondary | ICD-10-CM | POA: Diagnosis not present

## 2017-07-05 DIAGNOSIS — M9902 Segmental and somatic dysfunction of thoracic region: Secondary | ICD-10-CM | POA: Diagnosis not present

## 2017-07-05 DIAGNOSIS — M9901 Segmental and somatic dysfunction of cervical region: Secondary | ICD-10-CM | POA: Diagnosis not present

## 2017-07-05 DIAGNOSIS — R51 Headache: Secondary | ICD-10-CM | POA: Diagnosis not present

## 2017-07-05 DIAGNOSIS — M791 Myalgia, unspecified site: Secondary | ICD-10-CM | POA: Diagnosis not present

## 2017-07-21 ENCOUNTER — Ambulatory Visit (INDEPENDENT_AMBULATORY_CARE_PROVIDER_SITE_OTHER): Payer: Self-pay | Admitting: Internal Medicine

## 2017-07-21 ENCOUNTER — Encounter: Payer: Self-pay | Admitting: Internal Medicine

## 2017-07-21 VITALS — BP 126/80 | HR 79 | Temp 97.9°F | Ht 66.5 in | Wt 170.2 lb

## 2017-07-21 DIAGNOSIS — K645 Perianal venous thrombosis: Secondary | ICD-10-CM

## 2017-07-21 MED ORDER — VENLAFAXINE HCL ER 150 MG PO CP24: 150.0000 mg | ORAL_CAPSULE | Freq: Every day | ORAL | 2 refills | Status: DC

## 2017-07-21 MED ORDER — DIBUCAINE 1 % RE OINT: 1.0000 "application " | TOPICAL_OINTMENT | RECTAL | 0 refills | Status: DC | PRN

## 2017-07-21 NOTE — Progress Notes (Signed)
   Subjective:    Patient ID: Courtney Schultz, female    DOB: 1965-09-23, 51 y.o.   MRN: [id number]  HPI  51 year old patient who has a history of intermittent mild hemorrhoidal issues over the years which have been self-limited.  For the past week she has had painful bleeding hemorrhoids that have been refractory to topical agents ibuprofen and frequent sitz baths.  She has been forcing fluids and avoiding constipation issues.  She has been quite uncomfortable.  Pain is aggravated by sitting  History reviewed. No pertinent past medical history.   Social History   Socioeconomic History  . Marital status: Single    Spouse name: Not on file  . Number of children: Not on file  . Years of education: Not on file  . Highest education level: Not on file  Social Needs  . Financial resource strain: Not on file  . Food insecurity - worry: Not on file  . Food insecurity - inability: Not on file  . Transportation needs - medical: Not on file  . Transportation needs - non-medical: Not on file  Occupational History  . Not on file  Tobacco Use  . Smoking status: Former Smoker    Types: Cigarettes  . Smokeless tobacco: Never Used  Substance and Sexual Activity  . Alcohol use: Yes    Alcohol/week: 4.2 oz    Types: 7 Cans of beer per week  . Drug use: No  . Sexual activity: Not on file  Other Topics Concern  . Not on file  Social History Narrative  . Not on file    History reviewed. No pertinent surgical history.  History reviewed. No pertinent family history.  Allergies  Allergen Reactions  . Penicillins Anaphylaxis    No current outpatient medications on file prior to visit.   No current facility-administered medications on file prior to visit.     BP 126/80 (BP Location: Left Arm, Patient Position: Sitting, Cuff Size: Normal)   Pulse 79   Temp 97.9 F (36.6 C) (Oral)   Ht 5' 6.5" (1.689 m)   Wt 170 lb 3.2 oz (77.2 kg)   SpO2 97%   BMI 27.06 kg/m     Review of  Systems  Gastrointestinal: Positive for anal bleeding, blood in stool and rectal pain.       Objective:   Physical Exam  Constitutional: She appears well-developed and well-nourished. No distress.  Abdominal:  Thrombosed tender hemorrhoid external          Assessment & Plan:   Symptomatic thrombosed external hemorrhoid.  Discussed with [facility].  Patient has an appointment for 8:00 AM July 22, 2018 Patient was given a prescription for topical anesthetic Continue local measures including sitz baths  Laiklynn Raczynski Pilar Plate

## 2017-07-21 NOTE — Patient Instructions (Addendum)
Hemorrhoids Hemorrhoids are swollen veins in and around the rectum or anus. Hemorrhoids can cause pain, itching, or bleeding. Most of the time, they do not cause serious problems. They usually get better with diet changes, lifestyle changes, and other home treatments. Follow these instructions at home: Eating and drinking  Eat foods that have fiber, such as whole grains, beans, nuts, fruits, and vegetables. Ask your doctor about taking products that have added fiber (fibersupplements).  Drink enough fluid to keep your pee (urine) clear or pale yellow. For Pain and Swelling  Take a warm-water bath (sitz bath) for 20 minutes to ease pain. Do this 3-4 times a day.  If directed, put ice on the painful area. It may be helpful to use ice between your warm baths. ? Put ice in a plastic bag. ? Place a towel between your skin and the bag. ? Leave the ice on for 20 minutes, 2-3 times a day. General instructions  Take over-the-counter and prescription medicines only as told by your doctor. ? Medicated creams and medicines that are inserted into the anus (suppositories) may be used or applied as told.  Exercise often.  Go to the bathroom when you have the urge to poop (to have a bowel movement). Do not wait.  Avoid pushing too hard (straining) when you poop.  Keep the butt area dry and clean. Use wet toilet paper or moist paper towels.  Do not sit on the toilet for a long time. Contact a doctor if:  You have any of these: ? Pain and swelling that do not get better with treatment or medicine. ? Bleeding that will not stop. ? Trouble pooping or you cannot poop. ? Pain or swelling outside the area of the hemorrhoids. This information is not intended to replace advice given to you by your health care provider. Make sure you discuss any questions you have with your health care provider. Document Released: 05/11/2008 Document Revised: 01/08/2016 Document Reviewed: 04/16/2015 Elsevier  Interactive Patient Education  2018 [facility] Evaluation in am as scheduled

## 2017-07-22 DIAGNOSIS — K645 Perianal venous thrombosis: Secondary | ICD-10-CM | POA: Diagnosis not present

## 2017-08-11 ENCOUNTER — Encounter: Payer: Self-pay | Admitting: Internal Medicine

## 2017-08-30 ENCOUNTER — Encounter: Payer: Self-pay | Admitting: Internal Medicine

## 2017-08-30 ENCOUNTER — Ambulatory Visit (INDEPENDENT_AMBULATORY_CARE_PROVIDER_SITE_OTHER): Payer: BLUE CROSS/BLUE SHIELD | Admitting: Internal Medicine

## 2017-08-30 VITALS — BP 150/82 | HR 71 | Temp 98.0°F | Ht 66.5 in | Wt 173.0 lb

## 2017-08-30 DIAGNOSIS — J069 Acute upper respiratory infection, unspecified: Secondary | ICD-10-CM

## 2017-08-30 NOTE — Progress Notes (Signed)
Subjective:    Patient ID: Courtney Schultz, female    DOB: 1966/06/08, 52 y.o.   MRN: [id number]  HPI  52 year old patient who presents with a chief complaint of cough of 3 weeks duration.  Symptoms have improved a bit over the past week.  Cough is largely nonproductive.  She is a non-smoker.  No fever wheezing or shortness of breath.  She also complains of neck pain that is been fairly chronic.  She has had some chiropractic manipulations.  Pain is often aggravated by head turning to the left  She also complains of numbness involving both hands that is worse in the morning when she first awakens.  She states the numbness involves all 5 digits bilaterally  History reviewed. No pertinent past medical history.   Social History   Socioeconomic History  . Marital status: Single    Spouse name: Not on file  . Number of children: Not on file  . Years of education: Not on file  . Highest education level: Not on file  Social Needs  . Financial resource strain: Not on file  . Food insecurity - worry: Not on file  . Food insecurity - inability: Not on file  . Transportation needs - medical: Not on file  . Transportation needs - non-medical: Not on file  Occupational History  . Not on file  Tobacco Use  . Smoking status: Former Smoker    Types: Cigarettes  . Smokeless tobacco: Never Used  Substance and Sexual Activity  . Alcohol use: Yes    Alcohol/week: 4.2 oz    Types: 7 Cans of beer per week  . Drug use: No  . Sexual activity: Not on file  Other Topics Concern  . Not on file  Social History Narrative  . Not on file    History reviewed. No pertinent surgical history.  History reviewed. No pertinent family history.  Allergies  Allergen Reactions  . Penicillins Anaphylaxis    Current Outpatient Medications on File Prior to Visit  Medication Sig Dispense Refill  . dibucaine (NUPERCAINAL) 1 % OINT Place 1 application rectally as needed for hemorrhoids. 1 Tube 0  .  venlafaxine XR (EFFEXOR-XR) 150 MG 24 hr capsule Take 1 capsule (150 mg total) by mouth daily with breakfast. 90 capsule 2   No current facility-administered medications on file prior to visit.     BP (!) 150/82 (BP Location: Left Arm, Patient Position: Sitting, Cuff Size: Normal)   Pulse 71   Temp 98 F (36.7 C) (Oral)   Ht 5' 6.5" (1.689 m)   Wt 173 lb (78.5 kg)   SpO2 99%   BMI 27.50 kg/m     Review of Systems  Constitutional: Negative.   HENT: Negative for congestion, dental problem, hearing loss, rhinorrhea, sinus pressure, sore throat and tinnitus.   Eyes: Negative for pain, discharge and visual disturbance.  Respiratory: Positive for cough. Negative for shortness of breath.   Cardiovascular: Negative for chest pain, palpitations and leg swelling.  Gastrointestinal: Negative for abdominal distention, abdominal pain, blood in stool, constipation, diarrhea, nausea and vomiting.  Genitourinary: Negative for difficulty urinating, dysuria, flank pain, frequency, hematuria, pelvic pain, urgency, vaginal bleeding, vaginal discharge and vaginal pain.  Musculoskeletal: Positive for neck pain and neck stiffness. Negative for arthralgias, gait problem and joint swelling.  Skin: Negative for rash.  Neurological: Positive for numbness. Negative for dizziness, syncope, speech difficulty, weakness and headaches.  Hematological: Negative for adenopathy.  Psychiatric/Behavioral: Negative for agitation, behavioral problems  and dysphoric mood. The patient is not nervous/anxious.        Objective:   Physical Exam  HEENT exam-unremarkable Neck.  Fairly full range of motion slight discomfort in the posterior neck with head turning to the left Chest clear Cardiovascular no tachycardia Extremities.  Negative Tinel's        Assessment & Plan:   Viral URI with cough.  Will treat symptomatically Neck pain.  Suspect musculoligamentous Bilateral numbness of the hands.  Suspect carpal  tunnel syndrome patient will try short-term anti-inflammatory medications and a wrist splint bilaterally  Will call if unimproved  Nyoka Cowden

## 2017-08-30 NOTE — Patient Instructions (Addendum)
Acute bronchitis symptoms  are generally not helped by antibiotics.  Take over-the-counter expectorants and cough medications such as  Mucinex DM.  Call if there is no improvement in 5 to 7 days or if  you develop worsening cough, fever, or new symptoms, such as shortness of breath or chest pain.  Hydrate and Humidify  Drink enough water to keep your urine clear or pale yellow. Staying hydrated will help to thin your mucus.  Use a cool mist humidifier to keep the humidity level in your home above 50%.  Inhale steam for 10-15 minutes, 3-4 times a day or as told by your health care provider. You can do this in the bathroom while a hot shower is running.  Limit your exposure to cool or dry air. Rest  Rest as much as possible.   Cervical Sprain A cervical sprain is a stretch or tear in the tissues that connect bones (ligaments) in the neck. Most neck (cervical) sprains get better in 4-6 weeks. Follow these instructions at home: If you have a neck collar:  Wear it as told by your doctor. Do not take off (do not remove) the collar unless your doctor says that this is safe.  Ask your doctor before adjusting your collar.  If you have long hair, keep it outside of the collar.  Ask your doctor if you may take off the collar for cleaning and bathing. If you may take off the collar: ? Follow instructions from your doctor about how to take off the collar safely. ? Clean the collar by wiping it with mild soap and water. Let it air-dry all the way. ? If your collar has removable pads:  Take the pads out every 1-2 days.  Hand wash the pads with soap and water.  Let the pads air-dry all the way before you put them back in the collar. Do not dry them in a clothes dryer. Do not dry them with a hair dryer. ? Check your skin under the collar for irritation or sores. If you see any, tell your doctor. Managing pain, stiffness, and swelling  Use a cervical traction device, if told by your  doctor.  If told, put heat on the affected area. Do this before exercises (physical therapy) or as often as told by your doctor. Use the heat source that your doctor recommends, such as a moist heat pack or a heating pad. ? Place a towel between your skin and the heat source. ? Leave the heat on for 20-30 minutes. ? Take the heat off (remove the heat) if your skin turns bright red. This is very important if you cannot feel pain, heat, or cold. You may have a greater risk of getting burned.  Put ice on the affected area. ? Put ice in a plastic bag. ? Place a towel between your skin and the bag. ? Leave the ice on for 20 minutes, 2-3 times a day. Activity  Do not drive while wearing a neck collar. If you do not have a neck collar, ask your doctor if it is safe to drive.  Do not drive or use heavy machinery while taking prescription pain medicine or muscle relaxants, unless your doctor approves.  Do not lift anything that is heavier than 10 lb (4.5 kg) until your doctor tells you that it is safe.  Rest as told by your doctor.  Avoid activities that make you feel worse. Ask your doctor what activities are safe for you.  Do exercises as  told by your doctor or physical therapist. Preventing neck sprain  Practice good posture. Adjust your workstation to help with this, if needed.  Exercise regularly as told by your doctor or physical therapist.  Avoid activities that are risky or may cause a neck sprain (cervical sprain). General instructions  Take over-the-counter and prescription medicines only as told by your doctor.  Do not use any products that contain nicotine or tobacco. This includes cigarettes and e-cigarettes. If you need help quitting, ask your doctor.  Keep all follow-up visits as told by your doctor. This is important. Contact a doctor if:  You have pain or other symptoms that get worse.  You have symptoms that do not get better after 2 weeks.  You have pain that  does not get better with medicine.  You start to have new, unexplained symptoms.  You have sores or irritated skin from wearing your neck collar. Get help right away if:  You have very bad pain.  You have any of the following in any part of your body: ? Loss of feeling (numbness). ? Tingling. ? Weakness.  You cannot move a part of your body (you have paralysis).  Your activity level does not improve. Summary  A cervical sprain is a stretch or tear in the tissues that connect bones (ligaments) in the neck.  If you have a neck (cervical) collar, do not take off the collar unless your doctor says that this is safe.  Put ice on affected areas as told by your doctor.  Put heat on affected areas as told by your doctor.  Good posture and regular exercise can help prevent a neck sprain from happening again. This information is not intended to replace advice given to you by your health care provider. Make sure you discuss any questions you have with your health care provider. Document Released: 01/19/2008 Document Revised: 04/13/2016 Document Reviewed: 04/13/2016 Elsevier Interactive Patient Education  2017 [place] Syndrome Carpal tunnel syndrome is a condition that causes pain in your hand and arm. The carpal tunnel is a narrow area located on the palm side of your wrist. Repeated wrist motion or certain diseases may cause swelling within the tunnel. This swelling pinches the main nerve in the wrist (median nerve). What are the causes? This condition may be caused by:  Repeated wrist motions.  Wrist injuries.  Arthritis.  A cyst or tumor in the carpal tunnel.  Fluid buildup during pregnancy.  Sometimes the cause of this condition is not known. What increases the risk? This condition is more likely to develop in:  People who have jobs that cause them to repeatedly move their wrists in the same motion, such as Art gallery manager.  Women.  People  with certain conditions, such as: ? Diabetes. ? Obesity. ? An underactive thyroid (hypothyroidism). ? Kidney failure.  What are the signs or symptoms? Symptoms of this condition include:  A tingling feeling in your fingers, especially in your thumb, index, and middle fingers.  Tingling or numbness in your hand.  An aching feeling in your entire arm, especially when your wrist and elbow are bent for long periods of time.  Wrist pain that goes up your arm to your shoulder.  Pain that goes down into your palm or fingers.  A weak feeling in your hands. You may have trouble grabbing and holding items.  Your symptoms may feel worse during the night. How is this diagnosed? This condition is diagnosed with a medical  history and physical exam. You may also have tests, including:  An electromyogram (EMG). This test measures electrical signals sent by your nerves into the muscles.  X-rays.  How is this treated? Treatment for this condition includes:  Lifestyle changes. It is important to stop doing or modify the activity that caused your condition.  Physical or occupational therapy.  Medicines for pain and inflammation. This may include medicine that is injected into your wrist.  A wrist splint.  Surgery.  Follow these instructions at home: If you have a splint:  Wear it as told by your health care provider. Remove it only as told by your health care provider.  Loosen the splint if your fingers become numb and tingle, or if they turn cold and blue.  Keep the splint clean and dry. General instructions  Take over-the-counter and prescription medicines only as told by your health care provider.  Rest your wrist from any activity that may be causing your pain. If your condition is work related, talk to your employer about changes that can be made, such as getting a wrist pad to use while typing.  If directed, apply ice to the painful area: ? Put ice in a plastic  bag. ? Place a towel between your skin and the bag. ? Leave the ice on for 20 minutes, 2-3 times per day.  Keep all follow-up visits as told by your health care provider. This is important.  Do any exercises as told by your health care provider, physical therapist, or occupational therapist. Contact a health care provider if:  You have new symptoms.  Your pain is not controlled with medicines.  Your symptoms get worse. This information is not intended to replace advice given to you by your health care provider. Make sure you discuss any questions you have with your health care provider. Document Released: 07/30/2000 Document Revised: 12/11/2015 Document Reviewed: 12/18/2014 Elsevier Interactive Patient Education  [employer].

## 2017-10-22 ENCOUNTER — Other Ambulatory Visit: Payer: Self-pay | Admitting: Internal Medicine

## 2017-11-28 ENCOUNTER — Encounter: Payer: Self-pay | Admitting: Family Medicine

## 2017-11-28 ENCOUNTER — Ambulatory Visit (INDEPENDENT_AMBULATORY_CARE_PROVIDER_SITE_OTHER): Payer: BLUE CROSS/BLUE SHIELD | Admitting: Family Medicine

## 2017-11-28 ENCOUNTER — Ambulatory Visit (INDEPENDENT_AMBULATORY_CARE_PROVIDER_SITE_OTHER)
Admission: RE | Admit: 2017-11-28 | Discharge: 2017-11-28 | Disposition: A | Discharge status: Home / Self Care | Payer: BLUE CROSS/BLUE SHIELD | Source: Ambulatory Visit | Attending: Family Medicine | Admitting: Family Medicine

## 2017-11-28 VITALS — BP 125/83 | HR 70 | Temp 97.8°F | Resp 12 | Ht 66.5 in | Wt 167.2 lb

## 2017-11-28 DIAGNOSIS — M7541 Impingement syndrome of right shoulder: Secondary | ICD-10-CM | POA: Diagnosis not present

## 2017-11-28 DIAGNOSIS — R202 Paresthesia of skin: Secondary | ICD-10-CM

## 2017-11-28 DIAGNOSIS — R200 Anesthesia of skin: Secondary | ICD-10-CM | POA: Diagnosis not present

## 2017-11-28 DIAGNOSIS — M47812 Spondylosis without myelopathy or radiculopathy, cervical region: Secondary | ICD-10-CM | POA: Diagnosis not present

## 2017-11-28 DIAGNOSIS — M542 Cervicalgia: Secondary | ICD-10-CM

## 2017-11-28 LAB — TSH: TSH: 1.06 u[IU]/mL (ref 0.35–4.50)

## 2017-11-28 LAB — BASIC METABOLIC PANEL
BUN: 19 mg/dL (ref 6–23)
CO2: 25 meq/L (ref 19–32)
Calcium: 9.6 mg/dL (ref 8.4–10.5)
Chloride: 104 mEq/L (ref 96–112)
Creatinine, Ser: 0.89 mg/dL (ref 0.40–1.20)
GFR: 71.27 mL/min (ref >60.00)
GLUCOSE: 88 mg/dL (ref 70–99)
POTASSIUM: 4.3 meq/L (ref 3.5–5.1)
Sodium: 138 mEq/L (ref 135–145)

## 2017-11-28 MED ORDER — IBUPROFEN 600 MG PO TABS: 600.0000 mg | ORAL_TABLET | Freq: Three times a day (TID) | ORAL | 0 refills | Status: AC | PRN

## 2017-11-28 NOTE — Patient Instructions (Addendum)
  [name] I have seen you today for an acute visit.  A few things to remember from today's visit:   Numbness and tingling in right hand - Plan: Basic metabolic panel, TSH, DG Cervical Spine Complete  Impingement syndrome, shoulder, right - Plan: Ambulatory referral to Physical Therapy, ibuprofen (ADVIL,MOTRIN) 600 MG tablet  Cervicalgia - Plan: Ambulatory referral to Physical Therapy, DG Cervical Spine Complete, ibuprofen (ADVIL,MOTRIN) 600 MG tablet   Medications prescribed today are intended for short period of time and will not be refill upon request, a follow up appointment might be necessary to discuss continuation of of treatment if appropriate.    Today X ray was ordered.  This can be done at [facility] at [facility] between 8 am and 5 pm: [place]. [phone].   In general please monitor for signs of worsening symptoms and seek immediate medical attention if any concerning.    I hope you get better soon!

## 2017-11-28 NOTE — Progress Notes (Signed)
ACUTE VISIT   HPI:  Chief Complaint  Patient presents with  . Arm Pain    Right arm/shoulder muscle pain, ongoing issue that has gotten worse over the last few weeks  . Fatigue    extreme fatigue, lack of energy    [name] is a 52 y.o. female, who is here today complaining of "long time" bilateral UE "weakness."  Problem is intermittent, exacerbated by reaching out like movements. States that now she needs to use both hands to reach and hold objects she is trying to reach. Right is worse than left. Left handed. "Little" neck pain and numbness, tingling, and burning of right hand, sometimes she feels it radiated to elbow. Neck pain does not radiate. No associated headache,fever,oral lesions,cough,wheezing,dyspnea,or skin rash. Problem seems to be getting worse for the past 1-2 months. No OTC treatments.   Right shoulder pain,moderate, and intermittent for a few weeks. It is exacerbated by movement and alleviated by rest. No Hx of trauma. Stable overall.  She has not tried OTC treatments.  + Fatigue, which has ben going on for a while. Hx of depression and ADHD, follows with PCP.     Review of Systems  Constitutional: Positive for fatigue. Negative for chills, fever and unexpected weight change.  HENT: Negative for mouth sores, sore throat and trouble swallowing.   Respiratory: Negative for cough, shortness of breath and wheezing.   Cardiovascular: Negative for chest pain and palpitations.  Gastrointestinal: Negative for nausea and vomiting.  Endocrine: Negative for cold intolerance and heat intolerance.  Genitourinary: Negative for decreased urine volume, dysuria and hematuria.  Musculoskeletal: Positive for arthralgias and neck pain. Negative for gait problem.  Skin: Negative for rash.  Neurological: Positive for numbness. Negative for syncope, facial asymmetry, weakness and headaches.  Psychiatric/Behavioral: Negative for confusion. The patient is  nervous/anxious.       Current Outpatient Medications on File Prior to Visit  Medication Sig Dispense Refill  . venlafaxine XR (EFFEXOR-XR) 150 MG 24 hr capsule TAKE 1 CAPSULE (150 MG TOTAL) BY MOUTH DAILY WITH BREAKFAST. (Patient taking differently: Take 150 mg by mouth daily with breakfast. ) 90 capsule 0  . dibucaine (NUPERCAINAL) 1 % OINT Place 1 application rectally as needed for hemorrhoids. (Patient not taking: Reported on 11/28/2017) 1 Tube 0   No current facility-administered medications on file prior to visit.      History reviewed. No pertinent past medical history. Allergies  Allergen Reactions  . Penicillins Anaphylaxis    Social History   Socioeconomic History  . Marital status: Single    Spouse name: Not on file  . Number of children: Not on file  . Years of education: Not on file  . Highest education level: Not on file  Occupational History  . Not on file  Social Needs  . Financial resource strain: Not on file  . Food insecurity:    Worry: Not on file    Inability: Not on file  . Transportation needs:    Medical: Not on file    Non-medical: Not on file  Tobacco Use  . Smoking status: Former Smoker    Types: Cigarettes  . Smokeless tobacco: Never Used  Substance and Sexual Activity  . Alcohol use: Yes    Alcohol/week: 4.2 oz    Types: 7 Cans of beer per week  . Drug use: No  . Sexual activity: Not on file  Lifestyle  . Physical activity:    Days per week:  Not on file    Minutes per session: Not on file  . Stress: Not on file  Relationships  . Social connections:    Talks on phone: Not on file    Gets together: Not on file    Attends religious service: Not on file    Active member of club or organization: Not on file    Attends meetings of clubs or organizations: Not on file    Relationship status: Not on file  Other Topics Concern  . Not on file  Social History Narrative  . Not on file    Vitals:   11/28/17 0934  BP: 125/83  Pulse:  70  Resp: 12  Temp: 97.8 F (36.6 C)  SpO2: 98%   Body mass index is 26.59 kg/m.    Physical Exam  Nursing note and vitals reviewed. Constitutional: She is oriented to person, place, and time. She appears well-developed and well-nourished. She does not appear ill. No distress.  HENT:  Head: Normocephalic and atraumatic.  Eyes: Pupils are equal, round, and reactive to light. Conjunctivae are normal.  Cardiovascular: Normal rate and regular rhythm.  No murmur heard. Pulses:      Radial pulses are 2+ on the right side, and 2+ on the left side.  Respiratory: Effort normal and breath sounds normal. No respiratory distress.  Musculoskeletal: She exhibits no edema.       Right shoulder: She exhibits normal range of motion and no tenderness.       Cervical back: She exhibits decreased range of motion (Mild, left rotation.) and tenderness. She exhibits no bony tenderness.  Right shoulder mild pain elicited with empty can test,howkin's, and impingement signs. No tenderness upon palpation,edema,or erythema.  Tinel's' and Phalen's negative right wrist.  Lymphadenopathy:    She has no cervical adenopathy.  Neurological: She is alert and oriented to person, place, and time. She has normal strength. Gait normal.  Pronator drift neg.  Skin: Skin is warm. No rash noted. No erythema.  Psychiatric: Her mood appears anxious.  Well groomed, good eye contact.     ASSESSMENT AND PLAN:   Ms. Courtney Schultz was seen today for arm pain and fatigue.  Diagnoses and all orders for this visit:  Lab Results  Component Value Date   TSH 1.06 11/28/2017   Lab Results  Component Value Date   CREATININE 0.89 11/28/2017   BUN 19 11/28/2017   NA 138 11/28/2017   K 4.3 11/28/2017   CL 104 11/28/2017   CO2 25 11/28/2017    Numbness and tingling in right hand  Possible causes discussed. ?Radicular. ? Carpal tunnel synd. Night wrist splint to try. Further recommendations will be given according to labs  results. F/U with PCP in 4 weeks.  -     Basic metabolic panel -     TSH -     DG Cervical Spine Complete; Future  Impingement syndrome, shoulder, right  Mild,so I do not think subacromial injection is needed at this time. PT may help,she would like a referral today. Side effects of NSAID's discussed.  -     Ambulatory referral to Physical Therapy -     ibuprofen (ADVIL,MOTRIN) 600 MG tablet; Take 1 tablet (600 mg total) by mouth every 8 (eight) hours as needed for up to 10 days.  Cervicalgia  ROM exercises and local massage while PT is being arranged. Cervical X ray ordered today. F/U with PCP as needed.  -     Ambulatory referral to Physical  Therapy -     DG Cervical Spine Complete; Future -     ibuprofen (ADVIL,MOTRIN) 600 MG tablet; Take 1 tablet (600 mg total) by mouth every 8 (eight) hours as needed for up to 10 days.     Return in about 1 month (around 12/26/2017) for with PCP.     Mishika Flippen G. Martinique, MD  [facility]. [place] office.

## 2017-11-30 ENCOUNTER — Telehealth: Payer: Self-pay | Admitting: Internal Medicine

## 2017-11-30 NOTE — Telephone Encounter (Signed)
Copied from [place] [phone] [phone]. Topic: Quick Communication - Lab Results >> Nov 30, 2017  3:06 PM Darl Householder, RMA wrote: Patient is requesting imaging results, please call pt

## 2017-11-30 NOTE — Telephone Encounter (Signed)
Patient informed that Dr. Martinique has not looked at results and once she does I would give her a call back with results and recommendations.

## 2017-12-01 ENCOUNTER — Encounter: Payer: Self-pay | Admitting: Family Medicine

## 2017-12-02 ENCOUNTER — Encounter: Payer: Self-pay | Admitting: Family Medicine

## 2017-12-05 ENCOUNTER — Other Ambulatory Visit: Payer: Self-pay

## 2017-12-05 ENCOUNTER — Ambulatory Visit: Payer: BLUE CROSS/BLUE SHIELD | Attending: Family Medicine | Admitting: Physical Therapy

## 2017-12-05 ENCOUNTER — Encounter: Payer: Self-pay | Admitting: Physical Therapy

## 2017-12-05 DIAGNOSIS — M542 Cervicalgia: Secondary | ICD-10-CM

## 2017-12-05 DIAGNOSIS — R293 Abnormal posture: Secondary | ICD-10-CM | POA: Diagnosis not present

## 2017-12-05 DIAGNOSIS — M25511 Pain in right shoulder: Secondary | ICD-10-CM | POA: Insufficient documentation

## 2017-12-05 DIAGNOSIS — G8929 Other chronic pain: Secondary | ICD-10-CM | POA: Diagnosis not present

## 2017-12-06 NOTE — Therapy (Signed)
[facility] [address] [place], [place], [address] Phone: [phone]   Fax:  [phone]  Physical Therapy Evaluation  Patient Details  Name: Courtney Schultz MRN: [id number] Date of Birth: 1965-09-09 Referring Provider: Betty G Martinique, MD   Encounter Date: 12/05/2017  PT End of Session - 12/05/17 1616    Visit Number  1    Number of Visits  13    Date for PT Re-Evaluation  01/16/18    PT Start Time  1600    PT Stop Time  1645    PT Time Calculation (min)  45 min    Activity Tolerance  Patient tolerated treatment well    Behavior During Therapy  [facility] for tasks assessed/performed       History reviewed. No pertinent past medical history.  History reviewed. No pertinent surgical history.  There were no vitals filed for this visit.   Subjective Assessment - 12/05/17 1655    Subjective  Pt presents with R shoulder and L sided neck pain that began in the Fall of 2018    Pertinent History  Pain began in Fall 2018 without MOI, but gradual onset.  Pain started on L side of neck and noticed she could not rotate her head to the L as far as the R, then noticed her R shoulder pain.  Bil carpal tunnel pain began around the same time.  She saw a chiropractor with some improvement overall, but with significant improvement in carpal tunnel symptoms.  Her R shoulder is painful if she does any reaching activity.  Pt has to use LUE to reach.  Pain is in anterolateral R upper arm.  Pt works from home and developed carpal tunnel BUEs after starting new job in the Fall which requires more typing.  Pt denies pain in LUE.  Pt reports she has some intermittent tingling/shooting pain that shoots down her R arm when she is doing household activities.  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  5 days/wk, 8 hrs/day on her computer for work (work from home for [employer]).  Pt denies numbness/tingling in  either LE, weakness in either LE, bowel/bladder changes, night sweats, sudden changes in weight.  Goals: To be able to pick up coffee jar and put dishes away without pain, to be able to put her hair up without pain, reach for a cup/jar in front of her without pain.  Current pain: 6/10, best pain: 0/10, worst pain: 8/10. Denies neck or shoulder surgery or injury.  Pt is L handed.  No MOI; however, pt does report she built a pond last summer and was carrying lots of heavy rocks repetitively.     Limitations  -- reaching, fixing hair    Diagnostic tests  X-ray on 11/28/17 revealed minimal uncovertebral spurring is present at C4-5 and C5-6, right greater than left, no osseous foraminal narrowing is evident.    Patient Stated Goals  reaching without pain, putting up hair without pain    Currently in Pain?  Yes    Pain Score  6  with reaching motion    Pain Location  Shoulder    Pain Orientation  Right    Pain Descriptors / Indicators  Discomfort    Pain Type  Chronic pain    Pain Onset  More than a month ago    Pain Frequency  Intermittent    Aggravating Factors  see above    Pain Relieving Factors  see above    Effect of Pain on Daily Activities  see above    Multiple Pain Sites  No         [facility] PT Assessment - 12/05/17 1612      Assessment   Medical Diagnosis   impingement syndrome, shoulder, right and cervicalgia    Referring Provider  Betty G Martinique, MD    Onset Date/Surgical Date  06/06/17    Hand Dominance  Left    Next MD Visit  unsure    Prior Therapy  no      Precautions   Precautions  None      Restrictions   Weight Bearing Restrictions  No      Balance Screen   Has the patient fallen in the past 6 months  No    Has the patient had a decrease in activity level because of a fear of falling?   No    Is the patient reluctant to leave their home because of a fear of falling?   No      Home Environment   Living Environment  Private residence    Living Arrangements  Alone  alone most of the time, boyfriend works for [employer] (rarely hom    Type of [employer]  Two level;Able to live on main level with bedroom/bathroom      Prior Function   Level of Independence  Independent    Vocation  Full time employment    Vocation Requirements  sitting and typing    Leisure  working on her farm and garden      Cognition   Overall Cognitive Status  Within Functional Limits for tasks assessed       EXAMINATION   NDI: 10%   Palpation: TTP R dorsal proximal forearm, teres major/teres minor, infraspinatus, UT   Sensation: decreased sensation to light touch R C4 and C6 dermatomes   Reflexes: Bicep and patella Bil 2+   Coordination: slight dysmetria with finger to nose RUE   Posture: mildly rounded shoulders and forward head   Cervical AROM in deg (L, R):  F: 49  E: 35  Rotation: 35 painful L neck, 55  Sidebend: 33, 30 painful L neck   Shoulder AROM in deg (L, R):  F: 180 pain L superior shoulder, 150 pain in superior and lateral R shoulder  IR: WNL Bil and painfree  ER: 75, 70 pain lateral R elbow  Abd: 180, 142 pain lateral upper arm   Spurlings: positive, provocative with spurlings to L   Yergason's: negative Bil   Speed: positive Bil (R worse than L)   Hawkins-Kennedy: positive RUE   Horizontal Adduction: positive RUE and pt reports tingle in R wrist with this   Neer: positive RUE   Anterior Apprehension: neg BUE   ULTT (Median): positive L and R  ULTT (Ulnar): negative BUE  ULTT (Radial): positive R   Hoffman's sign: negative BUE   Shoulder Mobility: WNL Bil shoulders with inferior and AP glides           Objective measurements completed on examination: See above findings.              PT Education - 12/05/17 1615    Education provided  Yes    Education Details  POC, role of PT, examination findings, instructed pt to focus on proper posture throughout her day (especially with work activities)  Person(s) Educated  Patient    Methods  Explanation    Comprehension  Verbalized understanding       PT Short Term Goals - 12/06/17 1149      PT SHORT TERM GOAL #1   Title  Pt will independently complete HEP at least 4 days/wk for decreased pain    Time  2    Period  Weeks    Status  New        PT Long Term Goals - 12/06/17 1150      PT LONG TERM GOAL #1   Title  Pt will be able to put up her hair into ponytail without pain in R shoulder    Time  3    Period  Weeks    Status  New      PT LONG TERM GOAL #2   Title  Pt will be able to perform reaching activities (unloading dishwasher, reaching for cup in cabinet)    Baseline  painful    Time  6    Period  Weeks    Status  New      PT LONG TERM GOAL #3   Title  Pt's worst pain will decrease to at least 4/10 for improved QOL    Baseline  8/10    Time  5    Period  Weeks    Status  New      PT LONG TERM GOAL #4   Title  Pt's R shoulder AROM will improve to WNL and painfree to allow for full functional use of RUE    Baseline  F: 150 deg, Abd: 142 deg    Time  6    Period  Weeks    Status  New             Plan - 12/06/17 1145    Clinical Impression Statement  Pt is a 52 y/o F who presents with R shoulder and L neck pain which began ~6-7 months ago after building a pond and carrying heavy bags of rocks repetitively.  She presents with TTP and increased tension in surrouding R shoulder musculature.  She tested positive for several R shoulder special tests with possible impingement syndrome and demonstrates decreased R shoulder AROM due to tightness and pain.  Suspect cervical involvement with a positive L spurling's test and with positive Bil median nerve ULTT, in addition to a positive RUE radial nerve ULTT.  Pt demonstrates decreased cerivcal rotation to the L and muscular pain with R sidebending, stretching L neck musculature.  Red flags negative.  Pt will benefit from skilled PT interventions to decrease pain and  improve QOL.     History and Personal Factors relevant to plan of care:  (+) pt very active and would like to return to PLOF  (-) involvement of both shoulder and cervical regions, neural involvement, demands of work    Clinical Presentation  Stable    Clinical Presentation due to:  Pt's symptoms have not changed significantly since onset and pt has responded well to physical intervention in the the recent past (chiropractor)    Clinical Decision Making  Low    Rehab Potential  Good    PT Frequency  2x / week    PT Duration  6 weeks    PT Treatment/Interventions  ADLs/Self Care Home Management;Biofeedback;Cryotherapy;Electrical Stimulation;Iontophoresis 4mg /ml Dexamethasone;Moist Heat;Traction;Ultrasound;Fluidtherapy;Contrast Bath;DME Instruction;Gait training;Stair training;Functional mobility training;Therapeutic activities;Therapeutic exercise;Balance training;Neuromuscular re-education;Patient/family education;Manual techniques;Passive range of motion;Dry needling;Energy conservation;Taping    PT Next  Visit Plan  test mobility of cervical and thoracic spine, BUE strength testing, STM R shoulder region, initiate stretching, nerve glides, address posture and ergonomic setup     PT Home Exercise Plan  instructed pt to focus on proper posture throughout her day (especially with work activities)    Recommended Other Services  none at this time    Consulted and Agree with Plan of Care  Patient       Patient will benefit from skilled therapeutic intervention in order to improve the following deficits and impairments:  Decreased coordination, Decreased endurance, Decreased mobility, Decreased range of motion, Decreased strength, Hypomobility, Increased fascial restricitons, Increased muscle spasms, Impaired perceived functional ability, Impaired flexibility, Impaired sensation, Impaired UE functional use, Improper body mechanics, Postural dysfunction, Pain  Visit Diagnosis: Chronic right shoulder  pain  Cervicalgia  Abnormal posture     Problem List Patient Active Problem List   Diagnosis Date Noted  . Depression 10/17/2015  . ADHD (attention deficit hyperactivity disorder) 01/30/2013  . Acute upper respiratory infection 01/30/2009  . PEPTIC ULCER DISEASE 10/02/2007  . SORE THROAT 06/27/2007  . URTICARIA 06/27/2007    Collie Siad PT, DPT 12/06/2017, 11:55 AM  [facility] [address] [place], [place], [address] Phone: [phone]   Fax:  [phone]  Name: Courtney Schultz MRN: [id number] Date of Birth: 1966-06-10

## 2017-12-07 ENCOUNTER — Encounter: Payer: Self-pay | Admitting: Physical Therapy

## 2017-12-07 ENCOUNTER — Ambulatory Visit: Payer: BLUE CROSS/BLUE SHIELD | Admitting: Physical Therapy

## 2017-12-07 DIAGNOSIS — G8929 Other chronic pain: Secondary | ICD-10-CM

## 2017-12-07 DIAGNOSIS — M25511 Pain in right shoulder: Principal | ICD-10-CM

## 2017-12-07 DIAGNOSIS — R293 Abnormal posture: Secondary | ICD-10-CM | POA: Diagnosis not present

## 2017-12-07 DIAGNOSIS — M542 Cervicalgia: Secondary | ICD-10-CM

## 2017-12-07 NOTE — Therapy (Signed)
[facility] [address] [place], [place], [address] Phone: [phone]   Fax:  [phone]  Physical Therapy Treatment  Patient Details  Name: Courtney Schultz MRN: [id number] Date of Birth: Jun 16, 1966 Referring Provider: Betty G Martinique, MD   Encounter Date: 12/07/2017  PT End of Session - 12/07/17 1603    Visit Number  2    Number of Visits  13    Date for PT Re-Evaluation  01/16/18    PT Start Time  1601    PT Stop Time  1643    PT Time Calculation (min)  42 min    Activity Tolerance  Patient tolerated treatment well    Behavior During Therapy  [facility] for tasks assessed/performed       History reviewed. No pertinent past medical history.  History reviewed. No pertinent surgical history.  There were no vitals filed for this visit.  Subjective Assessment - 12/07/17 1603    Subjective  Pt doing well.  No new complaints or concerns.     Pertinent History  Pain began in Fall 2018 without MOI, but gradual onset.  Pain started on L side of neck and noticed she could not rotate her head to the L as far as the R, then noticed her R shoulder pain.  Bil carpal tunnel pain began around the same time.  She saw a chiropractor with some improvement overall, but with significant improvement in carpal tunnel symptoms.  Her R shoulder is painful if she does any reaching activity.  Pt has to use LUE to reach.  Pain is in anterolateral R upper arm.  Pt works from home and developed carpal tunnel BUEs after starting new job in the Fall which requires more typing.  Pt denies pain in LUE.  Pt reports she has some intermittent tingling/shooting pain that shoots down her R arm when she is doing household activities.  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  5 days/wk, 8 hrs/day on her computer for work (work from home for [employer]).  Pt denies numbness/tingling in either LE, weakness in either LE,  bowel/bladder changes, night sweats, sudden changes in weight.  Goals: To be able to pick up coffee jar and put dishes away without pain, to be able to put her hair up without pain, reach for a cup/jar in front of her without pain.  Current pain: 6/10, best pain: 0/10, worst pain: 8/10. Denies neck or shoulder surgery or injury.  Pt is L handed.  No MOI; however, pt does report she built a pond last summer and was carrying lots of heavy rocks repetitively.     Limitations  -- reaching, fixing hair    Diagnostic tests  X-ray on 11/28/17 revealed minimal uncovertebral spurring is present at C4-5 and C5-6, right greater than left, no osseous foraminal narrowing is evident.    Patient Stated Goals  reaching without pain, putting up hair without pain    Currently in Pain?  Yes    Pain Score  6     Pain Location  Shoulder    Pain Orientation  Right    Pain Descriptors / Indicators  Aching    Pain Type  Chronic pain    Pain Onset  More than a month ago    Multiple Pain Sites  No         [facility] PT Assessment - 12/07/17 1606  ROM / Strength   AROM / PROM / Strength  Strength      Strength   Overall Strength  Deficits    Strength Assessment Site  Shoulder;Elbow;Forearm    Right/Left Shoulder  Right;Left    Right Shoulder Flexion  4/5    Right Shoulder ABduction  4-/5    Right Shoulder Internal Rotation  5/5    Right Shoulder External Rotation  3/5    Left Shoulder Flexion  5/5    Left Shoulder ABduction  5/5    Left Shoulder Internal Rotation  5/5    Left Shoulder External Rotation  5/5    Right/Left Elbow  Right;Left    Right Elbow Flexion  5/5    Right Elbow Extension  5/5    Left Elbow Flexion  5/5    Left Elbow Extension  5/5    Right/Left Forearm  Right;Left    Right Forearm Pronation  5/5    Right Forearm Supination  5/5    Left Forearm Pronation  5/5    Left Forearm Supination  5/5         See flowsheet for BUE strength testing   Cervical spine mobility testing:  hypomobile and painful with CPA to C3 and UPA R C3 treated with grade III-IV mobs 2x30 seconds each segment   Thoracic spine mobility testing: hypomobility and painful T4-6. CPAs grade III-IV 2x30 second each segment T4-6.   Discussed ergonomic setup: including monitor setups, chair height, armrest height, swiveling with chair instead of turning head to look at screen   Seated Bil upper trap stretching 2x30 seconds each side (added to HEP)   Median nerve gliding BUE x10 each side (added to HEP)   Radial nerve gliding RUE x10 (added to HEP)   STM R UT and levator scap and R mid trap with ischemic compression techniques utilized with referring pain traveling down to R fingers                PT Education - 12/07/17 1602    Education provided  Yes    Education Details  Exercise technique; HEP    Person(s) Educated  Patient    Methods  Explanation;Demonstration;Verbal cues    Comprehension  Verbalized understanding;Returned demonstration;Verbal cues required;Need further instruction       PT Short Term Goals - 12/06/17 1149      PT SHORT TERM GOAL #1   Title  Pt will independently complete HEP at least 4 days/wk for decreased pain    Time  2    Period  Weeks    Status  New        PT Long Term Goals - 12/06/17 1150      PT LONG TERM GOAL #1   Title  Pt will be able to put up her hair into ponytail without pain in R shoulder    Time  3    Period  Weeks    Status  New      PT LONG TERM GOAL #2   Title  Pt will be able to perform reaching activities (unloading dishwasher, reaching for cup in cabinet)    Baseline  painful    Time  6    Period  Weeks    Status  New      PT LONG TERM GOAL #3   Title  Pt's worst pain will decrease to at least 4/10 for improved QOL    Baseline  8/10    Time  5  Period  Weeks    Status  New      PT LONG TERM GOAL #4   Title  Pt's R shoulder AROM will improve to WNL and painfree to allow for full functional use of RUE     Baseline  F: 150 deg, Abd: 142 deg    Time  6    Period  Weeks    Status  New            Plan - 12/07/17 1604    Clinical Impression Statement  Noted that pt with weakness in R shoulder F and Abd and significant weakness in R shoulder ER.  Pt demonstrates decreased mobility with CPAs to thoracic spine, thus treatment initiated.  Initiated median nerve glides BUE and radial nerve glide RUE and added to HEP.  Pt practiced proper upright posture using wall as tactile feedback.  Pt will benefit from continued skilled PT interventions for improved strength, ROM, and decreased pain in R shoulder.     Rehab Potential  Good    PT Frequency  2x / week    PT Duration  6 weeks    PT Treatment/Interventions  ADLs/Self Care Home Management;Biofeedback;Cryotherapy;Electrical Stimulation;Iontophoresis 4mg /ml Dexamethasone;Moist Heat;Traction;Ultrasound;Fluidtherapy;Contrast Bath;DME Instruction;Gait training;Stair training;Functional mobility training;Therapeutic activities;Therapeutic exercise;Balance training;Neuromuscular re-education;Patient/family education;Manual techniques;Passive range of motion;Dry needling;Energy conservation;Taping    PT Next Visit Plan  test mobility of cervical and thoracic spine, BUE strength testing, STM R shoulder region, initiate stretching, nerve glides, address posture and ergonomic setup     PT Home Exercise Plan  instructed pt to focus on proper posture throughout her day (especially with work activities)    Consulted and Agree with Plan of Care  Patient       Patient will benefit from skilled therapeutic intervention in order to improve the following deficits and impairments:  Decreased coordination, Decreased endurance, Decreased mobility, Decreased range of motion, Decreased strength, Hypomobility, Increased fascial restricitons, Increased muscle spasms, Impaired perceived functional ability, Impaired flexibility, Impaired sensation, Impaired UE functional use,  Improper body mechanics, Postural dysfunction, Pain  Visit Diagnosis: Chronic right shoulder pain  Cervicalgia  Abnormal posture     Problem List Patient Active Problem List   Diagnosis Date Noted  . Depression 10/17/2015  . ADHD (attention deficit hyperactivity disorder) 01/30/2013  . Acute upper respiratory infection 01/30/2009  . PEPTIC ULCER DISEASE 10/02/2007  . SORE THROAT 06/27/2007  . URTICARIA 06/27/2007    Collie Siad PT, DPT 12/07/2017, 4:44 PM  [facility] [address] [place], [place], [address] Phone: [phone]   Fax:  [phone]  Name: HAJER DWYER MRN: [id number] Date of Birth: 1966-02-14

## 2017-12-08 ENCOUNTER — Ambulatory Visit: Payer: BLUE CROSS/BLUE SHIELD | Admitting: Internal Medicine

## 2017-12-14 ENCOUNTER — Ambulatory Visit: Payer: BLUE CROSS/BLUE SHIELD | Attending: Family Medicine | Admitting: Physical Therapy

## 2017-12-14 ENCOUNTER — Encounter: Payer: Self-pay | Admitting: Physical Therapy

## 2017-12-14 DIAGNOSIS — M25511 Pain in right shoulder: Secondary | ICD-10-CM | POA: Insufficient documentation

## 2017-12-14 DIAGNOSIS — G8929 Other chronic pain: Secondary | ICD-10-CM

## 2017-12-14 DIAGNOSIS — R293 Abnormal posture: Secondary | ICD-10-CM | POA: Diagnosis not present

## 2017-12-14 DIAGNOSIS — M542 Cervicalgia: Secondary | ICD-10-CM | POA: Insufficient documentation

## 2017-12-16 ENCOUNTER — Ambulatory Visit: Payer: BLUE CROSS/BLUE SHIELD | Admitting: Physical Therapy

## 2017-12-16 ENCOUNTER — Encounter: Payer: Self-pay | Admitting: Physical Therapy

## 2017-12-16 DIAGNOSIS — M25511 Pain in right shoulder: Secondary | ICD-10-CM | POA: Diagnosis not present

## 2017-12-16 DIAGNOSIS — R293 Abnormal posture: Secondary | ICD-10-CM

## 2017-12-16 DIAGNOSIS — M542 Cervicalgia: Secondary | ICD-10-CM | POA: Diagnosis not present

## 2017-12-16 DIAGNOSIS — G8929 Other chronic pain: Secondary | ICD-10-CM

## 2017-12-18 NOTE — Therapy (Signed)
[facility] [address] [place], [place], [address] Phone: [phone]   Fax:  [phone]  Physical Therapy Treatment  Patient Details  Name: Courtney Schultz MRN: [id number] Date of Birth: February 14, 1966 Referring Provider: Betty G Martinique, MD   Encounter Date: 12/14/2017  PT End of Session - 12/18/17 1945    Visit Number  3    Number of Visits  13    Date for PT Re-Evaluation  01/16/18    PT Start Time  3474    PT Stop Time  0949    PT Time Calculation (min)  54 min    Activity Tolerance  Patient tolerated treatment well    Behavior During Therapy  [facility] for tasks assessed/performed       History reviewed. No pertinent past medical history.  History reviewed. No pertinent surgical history.  There were no vitals filed for this visit.    Pt. states she has symptoms in L neck/ R shoulder with yard tasks. Pt. reports lifting numerous boulders to build a small pond on property the past couple of days.        Treatment:  Manual:  Supine cervical UT/levator/ traction 4x each with static holds (no increase pain)  STM to B UT/ cervical paraspinals (L>R).  Prone central and unilateral PA grade II-III mobs. To C3-T4 (2x30 seconds each segment)   Prone trigger point release technique to B UT regions (may benefit from dry needling next tx. Session).  There.ex.:  Seated Bil upper trap stretching 2x30 seconds each side (added to HEP)   Median nerve gliding BUE x10 each side (added to HEP)   Radial nerve gliding RUE x10 (added to HEP)   Supine wand shoulder flexion/ abd. 10x each.   Reviewed HEP    PT Short Term Goals - 12/06/17 1149      PT SHORT TERM GOAL #1   Title  Pt will independently complete HEP at least 4 days/wk for decreased pain    Time  2    Period  Weeks    Status  New        PT Long Term Goals - 12/06/17 1150      PT LONG TERM GOAL #1   Title  Pt will be able to put up her hair into  ponytail without pain in R shoulder    Time  3    Period  Weeks    Status  New      PT LONG TERM GOAL #2   Title  Pt will be able to perform reaching activities (unloading dishwasher, reaching for cup in cabinet)    Baseline  painful    Time  6    Period  Weeks    Status  New      PT LONG TERM GOAL #3   Title  Pt's worst pain will decrease to at least 4/10 for improved QOL    Baseline  8/10    Time  5    Period  Weeks    Status  New      PT LONG TERM GOAL #4   Title  Pt's R shoulder AROM will improve to WNL and painfree to allow for full functional use of RUE    Baseline  F: 150 deg, Abd: 142 deg    Time  6    Period  Weeks    Status  New            Plan -  12/18/17 1947    Clinical Impression Statement  Pt. presents with L cervical/ R shoulder pain and discomfort with certain movement patterns.  Marked increase in B UT/levator stretches after STM and manual stretches.  Several UT trigger points noted and pt. may benefit from dry needling next tx. session.  No changes to HEP at this time.     Clinical Presentation  Stable    Clinical Decision Making  Low    Rehab Potential  Good    PT Frequency  2x / week    PT Duration  6 weeks    PT Treatment/Interventions  ADLs/Self Care Home Management;Biofeedback;Cryotherapy;Electrical Stimulation;Iontophoresis 4mg /ml Dexamethasone;Moist Heat;Traction;Ultrasound;Fluidtherapy;Contrast Bath;DME Instruction;Gait training;Stair training;Functional mobility training;Therapeutic activities;Therapeutic exercise;Balance training;Neuromuscular re-education;Patient/family education;Manual techniques;Passive range of motion;Dry needling;Energy conservation;Taping    PT Next Visit Plan  STM R shoulder region, initiate stretching, nerve glides, address posture and ergonomic setup.  Progress R sh. strengthening.     PT Home Exercise Plan  instructed pt to focus on proper posture throughout her day (especially with work activities)       Patient  will benefit from skilled therapeutic intervention in order to improve the following deficits and impairments:  Decreased coordination, Decreased endurance, Decreased mobility, Decreased range of motion, Decreased strength, Hypomobility, Increased fascial restricitons, Increased muscle spasms, Impaired perceived functional ability, Impaired flexibility, Impaired sensation, Impaired UE functional use, Improper body mechanics, Postural dysfunction, Pain  Visit Diagnosis: Chronic right shoulder pain  Cervicalgia  Abnormal posture     Problem List Patient Active Problem List   Diagnosis Date Noted  . Depression 10/17/2015  . ADHD (attention deficit hyperactivity disorder) 01/30/2013  . Acute upper respiratory infection 01/30/2009  . PEPTIC ULCER DISEASE 10/02/2007  . SORE THROAT 06/27/2007  . URTICARIA 06/27/2007   Pura Spice, PT, DPT # [phone] 12/18/2017, 8:00 PM   [facility] [address] [place], [place], [address] Phone: [phone]   Fax:  [phone]  Name: Courtney Schultz MRN: [id number] Date of Birth: March 14, 1966

## 2017-12-18 NOTE — Therapy (Signed)
[facility] [address] [place], [place], [address] Phone: [phone]   Fax:  [phone]  Physical Therapy Treatment  Patient Details  Name: Courtney Schultz MRN: [id number] Date of Birth: 03-10-1966 Referring Provider: Betty G Martinique, MD   Encounter Date: 12/16/2017  PT End of Session - 12/18/17 2007    Visit Number  4    Number of Visits  13    Date for PT Re-Evaluation  01/16/18    PT Start Time  8250    PT Stop Time  0949    PT Time Calculation (min)  52 min    Activity Tolerance  Patient tolerated treatment well    Behavior During Therapy  [facility] for tasks assessed/performed       History reviewed. No pertinent past medical history.  History reviewed. No pertinent surgical history.  There were no vitals filed for this visit.    Pt. feeling pretty good today. No pain at rest but >6/10 with overhead reaching with R shoulder        Treatment:  Manual:  Supine cervical UT/levator/ traction 4x each with static holds (no increase pain)  STM to B UT/ cervical paraspinals in supine/ seated posture.  Prone central and unilateral PA grade II-III mobs. To C3-T4 (2x30 seconds each segment)   Prone trigger point release technique to B UT regions.  Dry needling to L/R UT trigger points (4 needles with multiple muscle fasciculations noted)- good tx. Tolerance.    There.ex.:  Supine R shoulder stretches (all planes)- pec stretches.  Seated L/R UT and levator stretches 3x each.    Self-ULTT in seated posture (good technique with min. Correction provided).    PT Short Term Goals - 12/06/17 1149      PT SHORT TERM GOAL #1   Title  Pt will independently complete HEP at least 4 days/wk for decreased pain    Time  2    Period  Weeks    Status  New        PT Long Term Goals - 12/06/17 1150      PT LONG TERM GOAL #1   Title  Pt will be able to put up her hair into ponytail without pain in R shoulder    Time  3    Period  Weeks    Status  New      PT LONG TERM GOAL #2   Title  Pt will be able to perform reaching activities (unloading dishwasher, reaching for cup in cabinet)    Baseline  painful    Time  6    Period  Weeks    Status  New      PT LONG TERM GOAL #3   Title  Pt's worst pain will decrease to at least 4/10 for improved QOL    Baseline  8/10    Time  5    Period  Weeks    Status  New      PT LONG TERM GOAL #4   Title  Pt's R shoulder AROM will improve to WNL and painfree to allow for full functional use of RUE    Baseline  F: 150 deg, Abd: 142 deg    Time  6    Period  Weeks    Status  New            Plan - 12/18/17 2008    Clinical Impression Statement  Several muscle fasciculations noted during trigger  point dry needling to B UT region.  Limited ULTT in R UE due to pain with positioning.  Increase cervical AROM noted after manual stretches/ traction in supine position.  Pt. remains hypomobile in upper thoracic/ low cervical spine with prone mobs.      Clinical Presentation  Stable    Clinical Decision Making  Low    Rehab Potential  Good    PT Frequency  2x / week    PT Duration  6 weeks    PT Treatment/Interventions  ADLs/Self Care Home Management;Biofeedback;Cryotherapy;Electrical Stimulation;Iontophoresis 4mg /ml Dexamethasone;Moist Heat;Traction;Ultrasound;Fluidtherapy;Contrast Bath;DME Instruction;Gait training;Stair training;Functional mobility training;Therapeutic activities;Therapeutic exercise;Balance training;Neuromuscular re-education;Patient/family education;Manual techniques;Passive range of motion;Dry needling;Energy conservation;Taping    PT Next Visit Plan  STM R shoulder region, initiate stretching, nerve glides, address posture and ergonomic setup.  Progress R sh. strengthening.     PT Home Exercise Plan  instructed pt to focus on proper posture throughout her day (especially with work activities)       Patient will benefit from skilled  therapeutic intervention in order to improve the following deficits and impairments:  Decreased coordination, Decreased endurance, Decreased mobility, Decreased range of motion, Decreased strength, Hypomobility, Increased fascial restricitons, Increased muscle spasms, Impaired perceived functional ability, Impaired flexibility, Impaired sensation, Impaired UE functional use, Improper body mechanics, Postural dysfunction, Pain  Visit Diagnosis: Chronic right shoulder pain  Cervicalgia  Abnormal posture     Problem List Patient Active Problem List   Diagnosis Date Noted  . Depression 10/17/2015  . ADHD (attention deficit hyperactivity disorder) 01/30/2013  . Acute upper respiratory infection 01/30/2009  . PEPTIC ULCER DISEASE 10/02/2007  . SORE THROAT 06/27/2007  . URTICARIA 06/27/2007   Pura Spice, PT, DPT # [id number] 12/18/2017, 8:16 PM  [facility] [address] [place], [place], [address] Phone: [phone]   Fax:  [phone]  Name: Courtney Schultz MRN: [id number] Date of Birth: May 08, 1966

## 2017-12-19 ENCOUNTER — Ambulatory Visit: Payer: BLUE CROSS/BLUE SHIELD | Admitting: Physical Therapy

## 2017-12-19 ENCOUNTER — Encounter: Payer: Self-pay | Admitting: Physical Therapy

## 2017-12-19 DIAGNOSIS — M25511 Pain in right shoulder: Secondary | ICD-10-CM | POA: Diagnosis not present

## 2017-12-19 DIAGNOSIS — G8929 Other chronic pain: Secondary | ICD-10-CM | POA: Diagnosis not present

## 2017-12-19 DIAGNOSIS — R293 Abnormal posture: Secondary | ICD-10-CM | POA: Diagnosis not present

## 2017-12-19 DIAGNOSIS — M542 Cervicalgia: Secondary | ICD-10-CM

## 2017-12-19 NOTE — Therapy (Signed)
[facility] [address] [place], [place], [address] Phone: [phone]   Fax:  [phone]  Physical Therapy Treatment  Patient Details  Name: Courtney Schultz MRN: [id number] Date of Birth: Mar 18, 1966 Referring Provider: Betty G Martinique, MD   Encounter Date: 12/19/2017  PT End of Session - 12/19/17 0902    Visit Number  5    Number of Visits  13    Date for PT Re-Evaluation  01/16/18    PT Start Time  0902    PT Stop Time  0953    PT Time Calculation (min)  51 min    Activity Tolerance  Patient tolerated treatment well    Behavior During Therapy  [facility] for tasks assessed/performed       History reviewed. No pertinent past medical history.  History reviewed. No pertinent surgical history.  There were no vitals filed for this visit.  Subjective Assessment - 12/19/17 0901    Subjective  5/10 L side of neck/ 4/10 R shoulder at this time.   Pt. reports she was really busy in yard this past weekend.  Pt. had 9 tick bites this weekend.      Pertinent History  Pain began in Fall 2018 without MOI, but gradual onset.  Pain started on L side of neck and noticed she could not rotate her head to the L as far as the R, then noticed her R shoulder pain.  Bil carpal tunnel pain began around the same time.  She saw a chiropractor with some improvement overall, but with significant improvement in carpal tunnel symptoms.  Her R shoulder is painful if she does any reaching activity.  Pt has to use LUE to reach.  Pain is in anterolateral R upper arm.  Pt works from home and developed carpal tunnel BUEs after starting new job in the Fall which requires more typing.  Pt denies pain in LUE.  Pt reports she has some intermittent tingling/shooting pain that shoots down her R arm when she is doing household activities.  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  5 days/wk, 8 hrs/day on her computer for work (work from  home for [employer]).  Pt denies numbness/tingling in either LE, weakness in either LE, bowel/bladder changes, night sweats, sudden changes in weight.  Goals: To be able to pick up coffee jar and put dishes away without pain, to be able to put her hair up without pain, reach for a cup/jar in front of her without pain.  Current pain: 6/10, best pain: 0/10, worst pain: 8/10. Denies neck or shoulder surgery or injury.  Pt is L handed.  No MOI; however, pt does report she built a pond last summer and was carrying lots of heavy rocks repetitively.     Diagnostic tests  X-ray on 11/28/17 revealed minimal uncovertebral spurring is present at C4-5 and C5-6, right greater than left, no osseous foraminal narrowing is evident.    Patient Stated Goals  reaching without pain, putting up hair without pain    Currently in Pain?  Yes    Pain Score  5     Pain Location  Neck    Pain Orientation  Left    Pain Descriptors / Indicators  Aching    Pain Type  Chronic pain          Manual:  Supine cervical UT/levator/ traction 5x each with static holds (no  increase pain)  STM to B UT/ cervical paraspinals in supine/ seated posture.  R shoulder AP/inferior grade III mobs. 5x30 sec. (as tolerated).    Prone central and unilateralPAgrade II-III mobs. ToC3-T4 (2x30 seconds each segment)  Prone trigger point release technique to B UT regions.  Dry needling to L UT trigger points (2 needles with multiple muscle fasciculations noted)- good tx. Tolerance.    There.ex.:  Supine R shoulder stretches (all planes)- pec stretches.  Seated L/R UT and levator stretches 3x each.    Prone B scap. Retraction 10x (increase R forearm numbness/tingling).   Reviewed HEP.        PT Short Term Goals - 12/06/17 1149      PT SHORT TERM GOAL #1   Title  Pt will independently complete HEP at least 4 days/wk for decreased pain    Time  2    Period  Weeks    Status  New        PT Long Term Goals -  12/06/17 1150      PT LONG TERM GOAL #1   Title  Pt will be able to put up her hair into ponytail without pain in R shoulder    Time  3    Period  Weeks    Status  New      PT LONG TERM GOAL #2   Title  Pt will be able to perform reaching activities (unloading dishwasher, reaching for cup in cabinet)    Baseline  painful    Time  6    Period  Weeks    Status  New      PT LONG TERM GOAL #3   Title  Pt's worst pain will decrease to at least 4/10 for improved QOL    Baseline  8/10    Time  5    Period  Weeks    Status  New      PT LONG TERM GOAL #4   Title  Pt's R shoulder AROM will improve to WNL and painfree to allow for full functional use of RUE    Baseline  F: 150 deg, Abd: 142 deg    Time  6    Period  Weeks    Status  New         Plan - 12/19/17 4270    Clinical Impression Statement  Improving cervical/thoracic mobility with PA mobs. in prone position after manual stretches.  No increase c/o pain during manual tx. but increase R forearm symptoms/ tingling with prone B scap. retraction.  PT will issue more sh. strengthening ex./ R shoulder stretches to improve pain-free mobility.      Clinical Presentation  Stable    Clinical Decision Making  Low    Rehab Potential  Good    PT Frequency  2x / week    PT Duration  6 weeks    PT Treatment/Interventions  ADLs/Self Care Home Management;Biofeedback;Cryotherapy;Electrical Stimulation;Iontophoresis 4mg /ml Dexamethasone;Moist Heat;Traction;Ultrasound;Fluidtherapy;Contrast Bath;DME Instruction;Gait training;Stair training;Functional mobility training;Therapeutic activities;Therapeutic exercise;Balance training;Neuromuscular re-education;Patient/family education;Manual techniques;Passive range of motion;Dry needling;Energy conservation;Taping    PT Next Visit Plan  Issue new HEP next tx. and give new schedule for 1x/week x 2 weeks.      PT Home Exercise Plan  instructed pt to focus on proper posture throughout her day (especially  with work activities)    Consulted and Agree with Plan of Care  Patient       Patient will benefit from skilled therapeutic intervention in order  to improve the following deficits and impairments:  Decreased coordination, Decreased endurance, Decreased mobility, Decreased range of motion, Decreased strength, Hypomobility, Increased fascial restricitons, Increased muscle spasms, Impaired perceived functional ability, Impaired flexibility, Impaired sensation, Impaired UE functional use, Improper body mechanics, Postural dysfunction, Pain  Visit Diagnosis: Chronic right shoulder pain  Cervicalgia  Abnormal posture     Problem List Patient Active Problem List   Diagnosis Date Noted  . Depression 10/17/2015  . ADHD (attention deficit hyperactivity disorder) 01/30/2013  . Acute upper respiratory infection 01/30/2009  . PEPTIC ULCER DISEASE 10/02/2007  . SORE THROAT 06/27/2007  . URTICARIA 06/27/2007   Pura Spice, PT, DPT # [id number] 12/19/2017, 1:16 PM  [facility] [address] [place], [place], [address] Phone: [phone]   Fax:  [phone]  Name: Courtney Schultz MRN: [id number] Date of Birth: 01/04/1966

## 2017-12-22 ENCOUNTER — Ambulatory Visit: Payer: BLUE CROSS/BLUE SHIELD | Admitting: Physical Therapy

## 2017-12-22 DIAGNOSIS — M542 Cervicalgia: Secondary | ICD-10-CM

## 2017-12-22 DIAGNOSIS — M25511 Pain in right shoulder: Principal | ICD-10-CM

## 2017-12-22 DIAGNOSIS — R293 Abnormal posture: Secondary | ICD-10-CM | POA: Diagnosis not present

## 2017-12-22 DIAGNOSIS — G8929 Other chronic pain: Secondary | ICD-10-CM

## 2017-12-24 ENCOUNTER — Encounter: Payer: Self-pay | Admitting: Physical Therapy

## 2017-12-24 NOTE — Therapy (Addendum)
[facility] [address] [place], [place], [address] Phone: [phone]   Fax:  [phone]  Physical Therapy Treatment  Patient Details  Name: Courtney Schultz MRN: [id number] Date of Birth: Oct 16, 1965 Referring Provider: Betty G Martinique, MD   Encounter Date: 12/22/2017  PT End of Session - 12/24/17 1047    Visit Number  6    Number of Visits  13    Date for PT Re-Evaluation  01/16/18    PT Start Time  0904    PT Stop Time  1001    PT Time Calculation (min)  57 min    Activity Tolerance  Patient tolerated treatment well;Patient limited by pain    Behavior During Therapy  [facility] for tasks assessed/performed       History reviewed. No pertinent past medical history.  History reviewed. No pertinent surgical history.  There were no vitals filed for this visit.  Subjective Assessment - 12/24/17 1037    Pertinent History  Pain began in Fall 2018 without MOI, but gradual onset.  Pain started on L side of neck and noticed she could not rotate her head to the L as far as the R, then noticed her R shoulder pain.  Bil carpal tunnel pain began around the same time.  She saw a chiropractor with some improvement overall, but with significant improvement in carpal tunnel symptoms.  Her R shoulder is painful if she does any reaching activity.  Pt has to use LUE to reach.  Pain is in anterolateral R upper arm.  Pt works from home and developed carpal tunnel BUEs after starting new job in the Fall which requires more typing.  Pt denies pain in LUE.  Pt reports she has some intermittent tingling/shooting pain that shoots down her R arm when she is doing household activities.  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  5 days/wk, 8 hrs/day on her computer for work (work from home for [employer]).  Pt denies numbness/tingling in either LE, weakness in either LE, bowel/bladder changes, night sweats, sudden  changes in weight.  Goals: To be able to pick up coffee jar and put dishes away without pain, to be able to put her hair up without pain, reach for a cup/jar in front of her without pain.  Current pain: 6/10, best pain: 0/10, worst pain: 8/10. Denies neck or shoulder surgery or injury.  Pt is L handed.  No MOI; however, pt does report she built a pond last summer and was carrying lots of heavy rocks repetitively.     Diagnostic tests  X-ray on 11/28/17 revealed minimal uncovertebral spurring is present at C4-5 and C5-6, right greater than left, no osseous foraminal narrowing is evident.    Patient Stated Goals  reaching without pain, putting up hair without pain    Currently in Pain?  Yes    Pain Score  5     Pain Location  Neck    Pain Orientation  Left         Pt. reports soreness in B shoulder/elbows this morning.        Manual:  Supine cervical UT/levator/ traction 5x each with static holds (no increase pain)  STM to B UT/ cervical paraspinalsin supine position.   R shoulder AP/inferior grade III mobs. 5x30 sec. (as tolerated).    Prone central and unilateralPAgrade II-III mobs. ToC3-T4 (2x30 seconds each segment)  Prone  trigger point release technique to B UT regions. Dry needling to L/ R UT trigger points (2 needles with multiple muscle fasciculations noted)- good tx. Tolerance.   There.ex.:  Supine R shoulder stretches (all planes)- pec stretches.  See new HEP (issued RTB/GTB).           Pt. demonstrates good technique with addition of resisted ex. program. Pt. instructed to avoid any pain provoking movement patterns with RTB/GTB. Good tolerance with trigger point dry needling to UT region.     PT Short Term Goals - 12/06/17 1149      PT SHORT TERM GOAL #1   Title  Pt will independently complete HEP at least 4 days/wk for decreased pain    Time  2    Period  Weeks    Status  New        PT Long Term Goals - 12/06/17 1150      PT LONG  TERM GOAL #1   Title  Pt will be able to put up her hair into ponytail without pain in R shoulder    Time  3    Period  Weeks    Status  New      PT LONG TERM GOAL #2   Title  Pt will be able to perform reaching activities (unloading dishwasher, reaching for cup in cabinet)    Baseline  painful    Time  6    Period  Weeks    Status  New      PT LONG TERM GOAL #3   Title  Pt's worst pain will decrease to at least 4/10 for improved QOL    Baseline  8/10    Time  5    Period  Weeks    Status  New      PT LONG TERM GOAL #4   Title  Pt's R shoulder AROM will improve to WNL and painfree to allow for full functional use of RUE    Baseline  F: 150 deg, Abd: 142 deg    Time  6    Period  Weeks    Status  New            Plan - 12/24/17 1048    Clinical Presentation  Stable    Clinical Decision Making  Low    Rehab Potential  Good    PT Frequency  2x / week    PT Duration  6 weeks    PT Treatment/Interventions  ADLs/Self Care Home Management;Biofeedback;Cryotherapy;Electrical Stimulation;Iontophoresis 4mg /ml Dexamethasone;Moist Heat;Traction;Ultrasound;Fluidtherapy;Contrast Bath;DME Instruction;Gait training;Stair training;Functional mobility training;Therapeutic activities;Therapeutic exercise;Balance training;Neuromuscular re-education;Patient/family education;Manual techniques;Passive range of motion;Dry needling;Energy conservation;Taping       Patient will benefit from skilled therapeutic intervention in order to improve the following deficits and impairments:  Decreased coordination, Decreased endurance, Decreased mobility, Decreased range of motion, Decreased strength, Hypomobility, Increased fascial restricitons, Increased muscle spasms, Impaired perceived functional ability, Impaired flexibility, Impaired sensation, Impaired UE functional use, Improper body mechanics, Postural dysfunction, Pain  Visit Diagnosis: Chronic right shoulder pain  Cervicalgia  Abnormal  posture     Problem List Patient Active Problem List   Diagnosis Date Noted  . Depression 10/17/2015  . ADHD (attention deficit hyperactivity disorder) 01/30/2013  . Acute upper respiratory infection 01/30/2009  . PEPTIC ULCER DISEASE 10/02/2007  . SORE THROAT 06/27/2007  . URTICARIA 06/27/2007   Pura Spice, PT, DPT # [phone] 12/24/2017, 10:57 AM  [facility] [address] [place],  [place], [address] Phone: [phone]   Fax:  [phone]  Name: ALEATHEA PUGMIRE MRN: [id number] Date of Birth: [birth year]

## 2017-12-28 ENCOUNTER — Encounter: Payer: BLUE CROSS/BLUE SHIELD | Admitting: Physical Therapy

## 2017-12-29 ENCOUNTER — Ambulatory Visit: Payer: BLUE CROSS/BLUE SHIELD | Admitting: Physical Therapy

## 2017-12-29 ENCOUNTER — Encounter: Payer: Self-pay | Admitting: Physical Therapy

## 2017-12-29 DIAGNOSIS — G8929 Other chronic pain: Secondary | ICD-10-CM | POA: Diagnosis not present

## 2017-12-29 DIAGNOSIS — M25511 Pain in right shoulder: Secondary | ICD-10-CM | POA: Diagnosis not present

## 2017-12-29 DIAGNOSIS — R293 Abnormal posture: Secondary | ICD-10-CM

## 2017-12-29 DIAGNOSIS — M542 Cervicalgia: Secondary | ICD-10-CM | POA: Diagnosis not present

## 2017-12-29 NOTE — Therapy (Addendum)
[facility] [address] [place], [place], [address] Phone: [phone]   Fax:  [phone]  Physical Therapy Treatment  Patient Details  Name: Courtney Schultz MRN: [id number] Date of Birth: 02-Dec-1965 Referring Provider: Betty G Martinique, MD   Encounter Date: 12/29/2017  PT End of Session - 12/29/17 0725    Visit Number  7    Number of Visits  13    Date for PT Re-Evaluation  01/16/18    PT Start Time  0725    Activity Tolerance  Patient tolerated treatment well;Patient limited by pain    Behavior During Therapy  [facility] for tasks assessed/performed       History reviewed. No pertinent past medical history.  History reviewed. No pertinent surgical history.  There were no vitals filed for this visit.  Subjective Assessment - 12/29/17 0724    Pertinent History  Pain began in Fall 2018 without MOI, but gradual onset.  Pain started on L side of neck and noticed she could not rotate her head to the L as far as the R, then noticed her R shoulder pain.  Bil carpal tunnel pain began around the same time.  She saw a chiropractor with some improvement overall, but with significant improvement in carpal tunnel symptoms.  Her R shoulder is painful if she does any reaching activity.  Pt has to use LUE to reach.  Pain is in anterolateral R upper arm.  Pt works from home and developed carpal tunnel BUEs after starting new job in the Fall which requires more typing.  Pt denies pain in LUE.  Pt reports she has some intermittent tingling/shooting pain that shoots down her R arm when she is doing household activities.  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  5 days/wk, 8 hrs/day on her computer for work (work from home for [employer]).  Pt denies numbness/tingling in either LE, weakness in either LE, bowel/bladder changes, night sweats, sudden changes in weight.  Goals: To be able to pick up coffee jar and  put dishes away without pain, to be able to put her hair up without pain, reach for a cup/jar in front of her without pain.  Current pain: 6/10, best pain: 0/10, worst pain: 8/10. Denies neck or shoulder surgery or injury.  Pt is L handed.  No MOI; however, pt does report she built a pond last summer and was carrying lots of heavy rocks repetitively.     Diagnostic tests  X-ray on 11/28/17 revealed minimal uncovertebral spurring is present at C4-5 and C5-6, right greater than left, no osseous foraminal narrowing is evident.    Patient Stated Goals  reaching without pain, putting up hair without pain        Pt. reports doing pretty good today. 2/10 R shoulder pain with overhead reaching.        Manual:  Supine cervical UT/levator/ traction 5x each with static holds (no increase pain)  STM to B UT/ cervical paraspinalsin supine/ seated posture.  R shoulder AP/inferior grade III mobs. 5x30 sec. (as tolerated).    Prone central and unilateralPAgrade II-III mobs. ToC3-T4 (2x30 seconds each segment)  Prone trigger point release technique to B UT regions. Dry needling to L and R UT trigger points (2 needles with multiple muscle fasciculations noted)- good tx. Tolerance. STM to trigger points after needling.       Marked increase in R shoulder  AROM in seated posture today. Slight increase in R shoulder pain at end-range abduction. R UT trigger point less tender with palpation and pt. benefits from STM/ trigger point dry needling. No changes to resisted ex. program at this time.    PT Short Term Goals - 12/06/17 1149      PT SHORT TERM GOAL #1   Title  Pt will independently complete HEP at least 4 days/wk for decreased pain    Time  2    Period  Weeks    Status  New        PT Long Term Goals - 12/06/17 1150      PT LONG TERM GOAL #1   Title  Pt will be able to put up her hair into ponytail without pain in R shoulder    Time  3    Period  Weeks    Status  New       PT LONG TERM GOAL #2   Title  Pt will be able to perform reaching activities (unloading dishwasher, reaching for cup in cabinet)    Baseline  painful    Time  6    Period  Weeks    Status  New      PT LONG TERM GOAL #3   Title  Pt's worst pain will decrease to at least 4/10 for improved QOL    Baseline  8/10    Time  5    Period  Weeks    Status  New      PT LONG TERM GOAL #4   Title  Pt's R shoulder AROM will improve to WNL and painfree to allow for full functional use of RUE    Baseline  F: 150 deg, Abd: 142 deg    Time  6    Period  Weeks    Status  New            Plan - 12/29/17 0725    Clinical Presentation  Stable    Clinical Decision Making  Low    Rehab Potential  Good    PT Frequency  2x / week    PT Duration  6 weeks    PT Treatment/Interventions  ADLs/Self Care Home Management;Biofeedback;Cryotherapy;Electrical Stimulation;Iontophoresis 4mg /ml Dexamethasone;Moist Heat;Traction;Ultrasound;Fluidtherapy;Contrast Bath;DME Instruction;Gait training;Stair training;Functional mobility training;Therapeutic activities;Therapeutic exercise;Balance training;Neuromuscular re-education;Patient/family education;Manual techniques;Passive range of motion;Dry needling;Energy conservation;Taping    PT Next Visit Plan  Reassess goals next tx. session.      PT Home Exercise Plan  instructed pt to focus on proper posture throughout her day (especially with work activities)       Patient will benefit from skilled therapeutic intervention in order to improve the following deficits and impairments:  Decreased coordination, Decreased endurance, Decreased mobility, Decreased range of motion, Decreased strength, Hypomobility, Increased fascial restricitons, Increased muscle spasms, Impaired perceived functional ability, Impaired flexibility, Impaired sensation, Impaired UE functional use, Improper body mechanics, Postural dysfunction, Pain  Visit Diagnosis: Chronic right shoulder  pain  Cervicalgia  Abnormal posture     Problem List Patient Active Problem List   Diagnosis Date Noted  . Depression 10/17/2015  . ADHD (attention deficit hyperactivity disorder) 01/30/2013  . Acute upper respiratory infection 01/30/2009  . PEPTIC ULCER DISEASE 10/02/2007  . SORE THROAT 06/27/2007  . URTICARIA 06/27/2007   Pura Spice, PT, DPT # [phone] 12/29/2017, 7:26 AM  [facility] [address] [place], [place], [address] Phone: [phone]   Fax:  [phone]  Name: Courtney Schultz MRN: [id number] Date of Birth: [birth year]

## 2018-01-05 ENCOUNTER — Encounter: Payer: BLUE CROSS/BLUE SHIELD | Admitting: Physical Therapy

## 2018-01-16 ENCOUNTER — Encounter: Payer: Self-pay | Admitting: Physical Therapy

## 2018-01-16 ENCOUNTER — Ambulatory Visit: Payer: BLUE CROSS/BLUE SHIELD | Attending: Family Medicine | Admitting: Physical Therapy

## 2018-01-16 DIAGNOSIS — M25511 Pain in right shoulder: Secondary | ICD-10-CM | POA: Diagnosis not present

## 2018-01-16 DIAGNOSIS — G8929 Other chronic pain: Secondary | ICD-10-CM

## 2018-01-16 DIAGNOSIS — M542 Cervicalgia: Secondary | ICD-10-CM | POA: Diagnosis not present

## 2018-01-16 DIAGNOSIS — R293 Abnormal posture: Secondary | ICD-10-CM | POA: Diagnosis not present

## 2018-01-16 NOTE — Therapy (Signed)
[facility] [address] [place], [place], [address] Phone: [phone]   Fax:  [phone]  Physical Therapy Treatment  Patient Details  Name: Courtney Schultz MRN: [id number] Date of Birth: 06-29-1966 Referring Provider: Betty G Martinique, MD   Encounter Date: 01/16/2018  PT End of Session - 01/16/18 1015    Visit Number  8    Number of Visits  13    Date for PT Re-Evaluation  01/16/18    PT Start Time  0813    PT Stop Time  0906    PT Time Calculation (min)  53 min    Activity Tolerance  Patient tolerated treatment well;Patient limited by pain    Behavior During Therapy  [facility] for tasks assessed/performed       History reviewed. No pertinent past medical history.  History reviewed. No pertinent surgical history.  There were no vitals filed for this visit.  Subjective Assessment - 01/16/18 1013    Subjective  Pt. reports she is doing better with slight R shoulder discomfort with overhead activities.  Pt. is scrapping paint off her porch today.  Pt. reports marked improvement since starting dry needling.      Pertinent History  Pain began in Fall 2018 without MOI, but gradual onset.  Pain started on L side of neck and noticed she could not rotate her head to the L as far as the R, then noticed her R shoulder pain.  Bil carpal tunnel pain began around the same time.  She saw a chiropractor with some improvement overall, but with significant improvement in carpal tunnel symptoms.  Her R shoulder is painful if she does any reaching activity.  Pt has to use LUE to reach.  Pain is in anterolateral R upper arm.  Pt works from home and developed carpal tunnel BUEs after starting new job in the Fall which requires more typing.  Pt denies pain in LUE.  Pt reports she has some intermittent tingling/shooting pain that shoots down her R arm when she is doing household activities.  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  5 days/wk, 8 hrs/day on her computer for work (work from home for [employer]).  Pt denies numbness/tingling in either LE, weakness in either LE, bowel/bladder changes, night sweats, sudden changes in weight.  Goals: To be able to pick up coffee jar and put dishes away without pain, to be able to put her hair up without pain, reach for a cup/jar in front of her without pain.  Current pain: 6/10, best pain: 0/10, worst pain: 8/10. Denies neck or shoulder surgery or injury.  Pt is L handed.  No MOI; however, pt does report she built a pond last summer and was carrying lots of heavy rocks repetitively.     Diagnostic tests  X-ray on 11/28/17 revealed minimal uncovertebral spurring is present at C4-5 and C5-6, right greater than left, no osseous foraminal narrowing is evident.    Patient Stated Goals  reaching without pain, putting up hair without pain    Currently in Pain?  Yes    Pain Score  2     Pain Orientation  Right    Pain Descriptors / Indicators  Aching    Pain Type  Chronic pain        B UBE 3 min. F/b (consistent cadence/ no pain).      Manual:  Supine cervical  UT stretches 3x each.  Good mobility.    STM to B UT/ cervical paraspinalsin supine/ prone posture.  R shoulder AP/inferior grade III mobs. 5x30 sec. (as tolerated).  Prone trigger point release technique to B UT regions. Dry needling to L and R UT trigger points (2 needles with multiple muscle fasciculations noted)- good tx. Tolerance. STM to trigger points after needling.   There.ex.:  Reviewed HEP  Supine 3# shoulder flexion/ horizontal abd./ adduction 25x each.    PT Education - 01/15/18 1924    Education provided  Yes    Education Details  See HEP    Person(s) Educated  Patient    Methods  Explanation;Demonstration;Handout    Comprehension  Verbalized understanding;Returned demonstration       PT Short Term Goals - 12/06/17 1149      PT SHORT TERM GOAL #1   Title  Pt  will independently complete HEP at least 4 days/wk for decreased pain    Time  2    Period  Weeks    Status  New        PT Long Term Goals - 12/06/17 1150      PT LONG TERM GOAL #1   Title  Pt will be able to put up her hair into ponytail without pain in R shoulder    Time  3    Period  Weeks    Status  New      PT LONG TERM GOAL #2   Title  Pt will be able to perform reaching activities (unloading dishwasher, reaching for cup in cabinet)    Baseline  painful    Time  6    Period  Weeks    Status  New      PT LONG TERM GOAL #3   Title  Pt's worst pain will decrease to at least 4/10 for improved QOL    Baseline  8/10    Time  5    Period  Weeks    Status  New      PT LONG TERM GOAL #4   Title  Pt's R shoulder AROM will improve to WNL and painfree to allow for full functional use of RUE    Baseline  F: 150 deg, Abd: 142 deg    Time  6    Period  Weeks    Status  New            Plan - 01/16/18 1016    Clinical Impression Statement  B shoulder AROM WNL all planes with slight discomfort in R shoulder joint with end-range flexion/ abduction.  Marked trigger points in R and L UT region with increase pain reported during overpressure/ palpation.  (-) impingment testing today.  Pt. benefits from trigger point dry needling to decrease UT trigger points/ pain/ tenderness.  PT will reassess goals next tx. session.  Decrease frequency to 1x/week.      Clinical Presentation  Stable    Clinical Decision Making  Low    Rehab Potential  Good    PT Frequency  2x / week    PT Duration  6 weeks    PT Treatment/Interventions  ADLs/Self Care Home Management;Biofeedback;Cryotherapy;Electrical Stimulation;Iontophoresis 4mg /ml Dexamethasone;Moist Heat;Traction;Ultrasound;Fluidtherapy;Contrast Bath;DME Instruction;Gait training;Stair training;Functional mobility training;Therapeutic activities;Therapeutic exercise;Balance training;Neuromuscular re-education;Patient/family education;Manual  techniques;Passive range of motion;Dry needling;Energy conservation;Taping    PT Next Visit Plan  Reassess goals next tx. session.      PT Home Exercise Plan  instructed pt to focus on proper  posture throughout her day (especially with work activities)    Oncologist with Plan of Care  Patient       Patient will benefit from skilled therapeutic intervention in order to improve the following deficits and impairments:  Decreased coordination, Decreased endurance, Decreased mobility, Decreased range of motion, Decreased strength, Hypomobility, Increased fascial restricitons, Increased muscle spasms, Impaired perceived functional ability, Impaired flexibility, Impaired sensation, Impaired UE functional use, Improper body mechanics, Postural dysfunction, Pain  Visit Diagnosis: Chronic right shoulder pain  Cervicalgia  Abnormal posture     Problem List Patient Active Problem List   Diagnosis Date Noted  . Depression 10/17/2015  . ADHD (attention deficit hyperactivity disorder) 01/30/2013  . Acute upper respiratory infection 01/30/2009  . PEPTIC ULCER DISEASE 10/02/2007  . SORE THROAT 06/27/2007  . URTICARIA 06/27/2007   Pura Spice, PT, DPT # [id number] 01/16/2018, 10:21 AM  [place] [facility] [address] [place], [place], [address] Phone: [phone]   Fax:  [phone]  Name: Courtney Schultz MRN: [id number] Date of Birth: 1966-05-17

## 2018-01-23 ENCOUNTER — Ambulatory Visit: Payer: BLUE CROSS/BLUE SHIELD | Admitting: Physical Therapy

## 2018-01-23 ENCOUNTER — Encounter: Payer: Self-pay | Admitting: Physical Therapy

## 2018-01-23 DIAGNOSIS — G8929 Other chronic pain: Secondary | ICD-10-CM

## 2018-01-23 DIAGNOSIS — M25511 Pain in right shoulder: Principal | ICD-10-CM

## 2018-01-23 DIAGNOSIS — R293 Abnormal posture: Secondary | ICD-10-CM | POA: Diagnosis not present

## 2018-01-23 DIAGNOSIS — M542 Cervicalgia: Secondary | ICD-10-CM

## 2018-01-23 NOTE — Therapy (Signed)
[facility] [address] [place], [place], [address] Phone: [phone]   Fax:  [phone]  Physical Therapy Treatment  Patient Details  Name: Courtney Schultz MRN: [id number] Date of Birth: [birth year] Referring Provider: Betty G Martinique, MD   Encounter Date: 01/23/2018  PT End of Session - 01/23/18 0910    Visit Number  9    Number of Visits  13    Date for PT Re-Evaluation  02/20/18    PT Start Time  0809    PT Stop Time  0904    PT Time Calculation (min)  55 min    Activity Tolerance  Patient tolerated treatment well;Patient limited by pain    Behavior During Therapy  [facility] for tasks assessed/performed       History reviewed. No pertinent past medical history.  History reviewed. No pertinent surgical history.  There were no vitals filed for this visit.  Subjective Assessment - 01/23/18 0810    Subjective  Pt. reports to having a good week and weekend with house work and was able to get most of tasks done without much pain.  She reports that she focused on alternating hands when doing housework which assisted in preventing pain flare.  Reports to have slept weird last night and was stiff on L side of neck.  Pt reports slight discomfort in R elbow from working at home.     Pertinent History  Pain began in Fall 2018 without MOI, but gradual onset.  Pain started on L side of neck and noticed she could not rotate her head to the L as far as the R, then noticed her R shoulder pain.  Bil carpal tunnel pain began around the same time.  She saw a chiropractor with some improvement overall, but with significant improvement in carpal tunnel symptoms.  Her R shoulder is painful if she does any reaching activity.  Pt has to use LUE to reach.  Pain is in anterolateral R upper arm.  Pt works from home and developed carpal tunnel BUEs after starting new job in the Fall which requires more typing.  Pt denies pain in LUE.  Pt reports she has some  intermittent tingling/shooting pain that shoots down her R arm when she is doing household activities.  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  5 days/wk, 8 hrs/day on her computer for work (work from home for [employer]).  Pt denies numbness/tingling in either LE, weakness in either LE, bowel/bladder changes, night sweats, sudden changes in weight.  Goals: To be able to pick up coffee jar and put dishes away without pain, to be able to put her hair up without pain, reach for a cup/jar in front of her without pain.  Current pain: 6/10, best pain: 0/10, worst pain: 8/10. Denies neck or shoulder surgery or injury.  Pt is L handed.  No MOI; however, pt does report she built a pond last summer and was carrying lots of heavy rocks repetitively.     Diagnostic tests  X-ray on 11/28/17 revealed minimal uncovertebral spurring is present at C4-5 and C5-6, right greater than left, no osseous foraminal narrowing is evident.    Patient Stated Goals  reaching without pain, putting up hair without pain    Currently in Pain?  Yes    Pain Score  2     Pain Location  Elbow    Pain Orientation  Right    Pain Descriptors / Indicators  Aching    Pain Type  Chronic pain    Pain Onset  More than a month ago    Pain Frequency  Intermittent           There.ex.:  Nautilus: Seated Lat Pull Downs 50# x20 Nautilus Shoulder Adduction 30# x20 Scapular Retraction 30# x50 Tricep Extension 30# x20 Chest Press 30# x25   Manual:  Supine: Static Levator Stretch 30sec x2 Upper Trap Stretch 30sec x2 SCM Stretch 30sec x2 Traction B UE Shoulder ROM with overpressure in all planes Dry needling to L and R UT trigger points with STM after dry needling (4 needles used).  No ice or heat during tx. Today.      PT Short Term Goals - 01/23/18 0931      PT SHORT TERM GOAL #1   Title  Pt will independently complete HEP at least 4 days/wk for decreased pain    Time  2     Period  Weeks    Status  Achieved    Target Date  01/23/18        PT Long Term Goals - 01/23/18 0931      PT LONG TERM GOAL #1   Title  Pt will be able to put up her hair into ponytail without pain in R shoulder    Time  3    Period  Weeks    Status  Achieved    Target Date  01/23/18      PT LONG TERM GOAL #2   Title  Pt will be able to perform reaching activities (unloading dishwasher, reaching for cup in cabinet)    Baseline  marked improvement with slight discomfort    Time  4    Period  Weeks    Status  Partially Met    Target Date  02/20/18      PT LONG TERM GOAL #3   Title  Pt's worst pain will decrease to at least 4/10 for improved QOL    Baseline  2/10    Time  5    Period  Weeks    Status  Partially Met    Target Date  02/20/18      PT LONG TERM GOAL #4   Baseline  F: 150 deg, Abd: 142 deg    Time  4    Period  Weeks    Status  On-going    Target Date  02/20/18           Plan - 01/23/18 0911    Clinical Impression Statement  Pt continues to make significant improvements towards all PT goals, including shoulder ROM and function with marked decrease in neck/ shoulder pain.  Pt reports slight L sided neck tightness and demonstrates multiple trigger points in upper cervical region and in B UT region that is relieved with STM and dry needling.  Pt will continue with skilled PT services 1x/week with HEP focus to decrease neck pain, increase shoulder ROM to improve functional mobility with daily/ work-related tasks.      Clinical Presentation  Stable    Clinical Decision Making  Low    Rehab Potential  Good    PT Frequency  1x / week    PT Duration  6 weeks    PT Treatment/Interventions  ADLs/Self Care Home Management;Biofeedback;Cryotherapy;Electrical Stimulation;Iontophoresis 88m/ml Dexamethasone;Moist Heat;Traction;Ultrasound;Fluidtherapy;Contrast Bath;DME Instruction;Gait training;Stair training;Functional mobility training;Therapeutic  activities;Therapeutic exercise;Balance training;Neuromuscular re-education;Patient/family education;Manual techniques;Passive range of motion;Dry needling;Energy conservation;Taping  PT Next Visit Plan  Progress HEP next tx. session.  Reassess sh./neck ROM measurements.      PT Home Exercise Plan  instructed pt to focus on proper posture throughout her day (especially with work activities)       Patient will benefit from skilled therapeutic intervention in order to improve the following deficits and impairments:  Decreased coordination, Decreased endurance, Decreased mobility, Decreased range of motion, Decreased strength, Hypomobility, Increased fascial restricitons, Increased muscle spasms, Impaired perceived functional ability, Impaired flexibility, Impaired sensation, Impaired UE functional use, Improper body mechanics, Postural dysfunction, Pain  Visit Diagnosis: Chronic right shoulder pain  Cervicalgia  Abnormal posture     Problem List Patient Active Problem List   Diagnosis Date Noted  . Depression 10/17/2015  . ADHD (attention deficit hyperactivity disorder) 01/30/2013  . Acute upper respiratory infection 01/30/2009  . PEPTIC ULCER DISEASE 10/02/2007  . SORE THROAT 06/27/2007  . URTICARIA 06/27/2007   Pura Spice, PT, DPT # [phone] [place], SPT 01/23/2018, 9:33 AM   [facility] [address] [place], [place], [address] Phone: [phone]   Fax:  [phone]  Name: Courtney Schultz MRN: [id number] Date of Birth: November 12, 1965

## 2018-01-30 ENCOUNTER — Encounter: Payer: Self-pay | Admitting: Physical Therapy

## 2018-01-30 ENCOUNTER — Ambulatory Visit: Payer: BLUE CROSS/BLUE SHIELD | Admitting: Physical Therapy

## 2018-01-30 DIAGNOSIS — M542 Cervicalgia: Secondary | ICD-10-CM

## 2018-01-30 DIAGNOSIS — G8929 Other chronic pain: Secondary | ICD-10-CM | POA: Diagnosis not present

## 2018-01-30 DIAGNOSIS — R293 Abnormal posture: Secondary | ICD-10-CM

## 2018-01-30 DIAGNOSIS — M25511 Pain in right shoulder: Secondary | ICD-10-CM | POA: Diagnosis not present

## 2018-01-30 NOTE — Therapy (Signed)
[facility] [address] [place], [place], [address] Phone: [phone]   Fax:  [phone]  Physical Therapy Treatment  Patient Details  Name: Courtney Schultz MRN: [id number] Date of Birth: 1965-11-28 Referring Provider: Betty G Martinique, MD   Encounter Date: 01/30/2018  PT End of Session - 01/30/18 0834    Visit Number  10    Number of Visits  13    Date for PT Re-Evaluation  02/20/18    PT Start Time  0814    PT Stop Time  0910    PT Time Calculation (min)  56 min    Activity Tolerance  Patient tolerated treatment well;Patient limited by pain    Behavior During Therapy  [facility] for tasks assessed/performed       History reviewed. No pertinent past medical history.  History reviewed. No pertinent surgical history.  There were no vitals filed for this visit.  Subjective Assessment - 01/30/18 0947    Subjective  Pt. reports to be moving well this AM.  Pt. states that house work is completed and was able to finish painting her deck this past weekendwithout much discomfort.  Pt. reports left side of neck is still stiffer than right.    Pertinent History  Pain began in Fall 2018 without MOI, but gradual onset.  Pain started on L side of neck and noticed she could not rotate her head to the L as far as the R, then noticed her R shoulder pain.  Bil carpal tunnel pain began around the same time.  She saw a chiropractor with some improvement overall, but with significant improvement in carpal tunnel symptoms.  Her R shoulder is painful if she does any reaching activity.  Pt has to use LUE to reach.  Pain is in anterolateral R upper arm.  Pt works from home and developed carpal tunnel BUEs after starting new job in the Fall which requires more typing.  Pt denies pain in LUE.  Pt reports she has some intermittent tingling/shooting pain that shoots down her R arm when she is doing household activities.  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  5 days/wk, 8 hrs/day on her computer for work (work from home for [employer]).  Pt denies numbness/tingling in either LE, weakness in either LE, bowel/bladder changes, night sweats, sudden changes in weight.  Goals: To be able to pick up coffee jar and put dishes away without pain, to be able to put her hair up without pain, reach for a cup/jar in front of her without pain.  Current pain: 6/10, best pain: 0/10, worst pain: 8/10. Denies neck or shoulder surgery or injury.  Pt is L handed.  No MOI; however, pt does report she built a pond last summer and was carrying lots of heavy rocks repetitively.     Diagnostic tests  X-ray on 11/28/17 revealed minimal uncovertebral spurring is present at C4-5 and C5-6, right greater than left, no osseous foraminal narrowing is evident.    Patient Stated Goals  reaching without pain, putting up hair without pain    Currently in Pain?  No/denies        L/R AROM: flexion (171/167 deg.), abd. (168/ 167 deg.).   Cervical AROM (R/L): lateral (25/ 35 deg.), flexion 43 deg., 56 deg., rotation (58/ 57 deg.).  No pain reported during ROM reassessment.       There.ex.:  B  UBE 2 min. F/b. (warm-up/no charge).   Prone on ball: Y's, T's and I's 20x each.    Nautilus: Seated Lat Pull Downs 50# x20 Nautilus Shoulder Adduction 30# x20 Scapular Retraction 50# x50 Tricep Extension 30# x20 Chest Press 30# x25   Manual:  Supine: Static Levator Stretch 30sec x2 Upper Trap Stretch 30sec x2 SCM Stretch 30sec x2 Manual traction: 3x with static holds as tolerated.  No pain.   B UE Shoulder ROM with overpressure in all planes Dry needling to L UT trigger points with STM after dry needling (2 needles used)- marked muscle fasciculation noted.  No ice or heat during tx. Today.        PT Short Term Goals - 01/23/18 0931      PT SHORT TERM GOAL #1   Title  Pt will independently complete HEP at least 4 days/wk for  decreased pain    Time  2    Period  Weeks    Status  Achieved    Target Date  01/23/18        PT Long Term Goals - 01/23/18 0931      PT LONG TERM GOAL #1   Title  Pt will be able to put up her hair into ponytail without pain in R shoulder    Time  3    Period  Weeks    Status  Achieved    Target Date  01/23/18      PT LONG TERM GOAL #2   Title  Pt will be able to perform reaching activities (unloading dishwasher, reaching for cup in cabinet)    Baseline  marked improvement with slight discomfort    Time  4    Period  Weeks    Status  Partially Met    Target Date  02/20/18      PT LONG TERM GOAL #3   Title  Pt's worst pain will decrease to at least 4/10 for improved QOL    Baseline  2/10    Time  5    Period  Weeks    Status  Partially Met    Target Date  02/20/18      PT LONG TERM GOAL #4   Baseline  F: 150 deg, Abd: 142 deg    Time  4    Period  Weeks    Status  On-going    Target Date  02/20/18            Plan - 01/30/18 0834    Clinical Impression Statement  Pt. demonstrates increase ROM of cervical spine in all planes of movement.  Pt. is continuing to make vast improvements in motion with significant decrease in pain as well.  Noted difference in lateral cervical flexion to the R when compared to the left due to L UT tightness.  Pt. able to tolerate dry needling in L UT to relieve tightness and improve cervical ROM.      Clinical Presentation  Stable    Clinical Decision Making  Low    Rehab Potential  Good    PT Frequency  1x / week    PT Duration  6 weeks    PT Treatment/Interventions  ADLs/Self Care Home Management;Biofeedback;Cryotherapy;Electrical Stimulation;Iontophoresis 24m/ml Dexamethasone;Moist Heat;Traction;Ultrasound;Fluidtherapy;Contrast Bath;DME Instruction;Gait training;Stair training;Functional mobility training;Therapeutic activities;Therapeutic exercise;Balance training;Neuromuscular re-education;Patient/family education;Manual  techniques;Passive range of motion;Dry needling;Energy conservation;Taping    PT Next Visit Plan  Progress HEP.      PT Home Exercise Plan  instructed pt to focus  on proper posture throughout her day (especially with work activities)    Oncologist with Plan of Care  Patient       Patient will benefit from skilled therapeutic intervention in order to improve the following deficits and impairments:  Decreased coordination, Decreased endurance, Decreased mobility, Decreased range of motion, Decreased strength, Hypomobility, Increased fascial restricitons, Increased muscle spasms, Impaired perceived functional ability, Impaired flexibility, Impaired sensation, Impaired UE functional use, Improper body mechanics, Postural dysfunction, Pain  Visit Diagnosis: Chronic right shoulder pain  Cervicalgia  Abnormal posture     Problem List Patient Active Problem List   Diagnosis Date Noted  . Depression 10/17/2015  . ADHD (attention deficit hyperactivity disorder) 01/30/2013  . Acute upper respiratory infection 01/30/2009  . PEPTIC ULCER DISEASE 10/02/2007  . SORE THROAT 06/27/2007  . URTICARIA 06/27/2007   Pura Spice, PT, DPT # [id number] 01/30/2018, 10:28 AM  [place] [address] [place], [place], [address] Phone: [phone]   Fax:  [phone]  Name: Courtney Schultz MRN: [id number] Date of Birth: 1965/09/21

## 2018-02-06 ENCOUNTER — Encounter: Payer: Self-pay | Admitting: Physical Therapy

## 2018-02-06 ENCOUNTER — Ambulatory Visit: Payer: BLUE CROSS/BLUE SHIELD | Admitting: Physical Therapy

## 2018-02-06 DIAGNOSIS — G8929 Other chronic pain: Secondary | ICD-10-CM | POA: Diagnosis not present

## 2018-02-06 DIAGNOSIS — M542 Cervicalgia: Secondary | ICD-10-CM | POA: Diagnosis not present

## 2018-02-06 DIAGNOSIS — R293 Abnormal posture: Secondary | ICD-10-CM

## 2018-02-06 DIAGNOSIS — M25511 Pain in right shoulder: Principal | ICD-10-CM

## 2018-02-06 NOTE — Therapy (Signed)
[facility] [address] [place], [place], [address] Phone: [phone]   Fax:  [phone]  Physical Therapy Treatment  Patient Details  Name: Courtney Schultz MRN: [id number] Date of Birth: 1966-03-25 Referring Provider: Betty G Martinique, MD   Encounter Date: 02/06/2018  PT End of Session - 02/06/18 0908    Visit Number  11    Number of Visits  13    Date for PT Re-Evaluation  02/20/18    PT Start Time  0814    PT Stop Time  0915    PT Time Calculation (min)  61 min    Activity Tolerance  Patient tolerated treatment well;Patient limited by pain    Behavior During Therapy  [facility] for tasks assessed/performed       History reviewed. No pertinent past medical history.  History reviewed. No pertinent surgical history.  There were no vitals filed for this visit.  Subjective Assessment - 02/06/18 0822    Subjective  Pt. reports that [place] and was able to do activities without pain.  Pt. reports that her arm is "a little angry" this morning, but is still able to do tasks and pain is mostly in elbow.      Pertinent History  Pain began in Fall 2018 without MOI, but gradual onset.  Pain started on L side of neck and noticed she could not rotate her head to the L as far as the R, then noticed her R shoulder pain.  Bil carpal tunnel pain began around the same time.  She saw a chiropractor with some improvement overall, but with significant improvement in carpal tunnel symptoms.  Her R shoulder is painful if she does any reaching activity.  Pt has to use LUE to reach.  Pain is in anterolateral R upper arm.  Pt works from home and developed carpal tunnel BUEs after starting new job in the Fall which requires more typing.  Pt denies pain in LUE.  Pt reports she has some intermittent tingling/shooting pain that shoots down her R arm when she is doing household activities.  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  5 days/wk, 8 hrs/day on her computer for work (work from home for [employer]).  Pt denies numbness/tingling in either LE, weakness in either LE, bowel/bladder changes, night sweats, sudden changes in weight.  Goals: To be able to pick up coffee jar and put dishes away without pain, to be able to put her hair up without pain, reach for a cup/jar in front of her without pain.  Current pain: 6/10, best pain: 0/10, worst pain: 8/10. Denies neck or shoulder surgery or injury.  Pt is L handed.  No MOI; however, pt does report she built a pond last summer and was carrying lots of heavy rocks repetitively.     Diagnostic tests  X-ray on 11/28/17 revealed minimal uncovertebral spurring is present at C4-5 and C5-6, right greater than left, no osseous foraminal narrowing is evident.    Patient Stated Goals  reaching without pain, putting up hair without pain    Currently in Pain?  Yes    Pain Score  2     Pain Location  Elbow    Pain Orientation  Right    Pain Descriptors / Indicators  Aching    Pain Type  Chronic pain    Pain Radiating Towards  Towards  fingers, specifically in R middle phalange.    Pain Onset  More than a month ago    Pain Frequency  Intermittent    Aggravating Factors   Pressure applied    Multiple Pain Sites  No       There.ex.:   Prone on ball with 3#: Y's, T's and I's 20x each.     Nautilus: Seated Lat Pull Downs 50# x20 Nautilus Shoulder Adduction 30# x20 Scapular Retraction 50# x20 Tricep Extension 40# x20 Chest Press 50# x20 IR 20# x20 ER 20# x20    Manual:   Supine: Static Levator Stretch 30sec x2 Upper Trap Stretch 30sec x2 SCM Stretch 30sec x2 Manual traction: 3x with static holds as tolerated.  No pain.   B UE Shoulder ROM with overpressure in all planes   No ice or heat during tx. today.        PT Education - 02/06/18 0907    Education provided  Yes    Education Details  Demonstrated nerve glides and STM to right  extensor compartment for pain relief.    Person(s) Educated  Patient    Methods  Explanation;Demonstration;Tactile cues    Comprehension  Verbalized understanding;Returned demonstration       PT Short Term Goals - 01/23/18 0931      PT SHORT TERM GOAL #1   Title  Pt will independently complete HEP at least 4 days/wk for decreased pain    Time  2    Period  Weeks    Status  Achieved    Target Date  01/23/18        PT Long Term Goals - 02/06/18 1347      PT LONG TERM GOAL #1   Title  Pt will be able to put up her hair into ponytail without pain in R shoulder    Time  3    Period  Weeks    Status  Achieved    Target Date  01/23/18      PT LONG TERM GOAL #2   Title  Pt will be able to perform reaching activities (unloading dishwasher, reaching for cup in cabinet)    Baseline  marked improvement with slight discomfort    Time  4    Period  Weeks    Status  Achieved    Target Date  02/20/18      PT LONG TERM GOAL #3   Title  Pt's worst pain will decrease to at least 4/10 for improved QOL    Baseline  2/10    Time  5    Period  Weeks    Status  Partially Met    Target Date  02/20/18      PT LONG TERM GOAL #4   Title  Pt's R shoulder AROM will improve to WNL and painfree to allow for full functional use of RUE    Baseline  WNL with no pain    Time  4    Period  Weeks    Status  Achieved    Target Date  02/20/18         Plan - 02/06/18 0909    Clinical Impression Statement  Pt. demonstrates R shoulder AROM WNL.  Discomfort felt during median nerve glide, which patient has reported to have had issues with in past.  Pt. continues to demonstrate increased strength with ther ex and is able to move shoulder in all planes of movement without increase in pain.  Pt. tolerated STM  to elbow/ wrist extensors well, noting tingling in third digit with STM to extensor bundle.  Dx was discussed with pt. and was in agreeance to assessment.    Clinical Presentation  Stable     Clinical Decision Making  Low    Rehab Potential  Good    PT Frequency  1x / week    PT Duration  6 weeks    PT Treatment/Interventions  ADLs/Self Care Home Management;Biofeedback;Cryotherapy;Electrical Stimulation;Iontophoresis 42m/ml Dexamethasone;Moist Heat;Traction;Ultrasound;Fluidtherapy;Contrast Bath;DME Instruction;Gait training;Stair training;Functional mobility training;Therapeutic activities;Therapeutic exercise;Balance training;Neuromuscular re-education;Patient/family education;Manual techniques;Passive range of motion;Dry needling;Energy conservation;Taping    PT Next Visit Plan  1 more PT visit in 2 weeks.  Pt. instructed to contact PT if any regression in symptoms prior to next PT visit.      PT Home Exercise Plan  instructed pt to focus on proper posture throughout her day (especially with work activities)    Consulted and Agree with Plan of Care  Patient       Patient will benefit from skilled therapeutic intervention in order to improve the following deficits and impairments:  Decreased coordination, Decreased endurance, Decreased mobility, Decreased range of motion, Decreased strength, Hypomobility, Increased fascial restricitons, Increased muscle spasms, Impaired perceived functional ability, Impaired flexibility, Impaired sensation, Impaired UE functional use, Improper body mechanics, Postural dysfunction, Pain  Visit Diagnosis: Chronic right shoulder pain  Cervicalgia  Abnormal posture     Problem List Patient Active Problem List   Diagnosis Date Noted  . Depression 10/17/2015  . ADHD (attention deficit hyperactivity disorder) 01/30/2013  . Acute upper respiratory infection 01/30/2009  . PEPTIC ULCER DISEASE 10/02/2007  . SORE THROAT 06/27/2007  . URTICARIA 06/27/2007   MPura Spice PT, DPT # [id number] Nation SPT 02/06/2018, 5:06 PM  [facility]MEros NAl[place] [address]Phone:  [phone]  Fax:  [phone] Name: Courtney SCHEMMMRN: [id number] of Birth: 117-Jan-1967

## 2018-02-20 ENCOUNTER — Ambulatory Visit: Payer: BLUE CROSS/BLUE SHIELD | Attending: Family Medicine | Admitting: Physical Therapy

## 2018-02-20 ENCOUNTER — Encounter: Payer: Self-pay | Admitting: Physical Therapy

## 2018-02-20 DIAGNOSIS — R293 Abnormal posture: Secondary | ICD-10-CM | POA: Diagnosis not present

## 2018-02-20 DIAGNOSIS — G8929 Other chronic pain: Secondary | ICD-10-CM | POA: Insufficient documentation

## 2018-02-20 DIAGNOSIS — M542 Cervicalgia: Secondary | ICD-10-CM | POA: Insufficient documentation

## 2018-02-20 DIAGNOSIS — M25511 Pain in right shoulder: Secondary | ICD-10-CM | POA: Insufficient documentation

## 2018-02-20 NOTE — Therapy (Signed)
[facility] [address] [place], [place], [address] Phone: [phone]   Fax:  [phone]  Physical Therapy Treatment  Patient Details  Name: Courtney Schultz MRN: [id number] Date of Birth: May 13, 1966 Referring Provider: Betty G Martinique, MD   Encounter Date: 02/20/2018  PT End of Session - 02/20/18 1329    Visit Number  12    Number of Visits  13    Date for PT Re-Evaluation  02/20/18    PT Start Time  0829    PT Stop Time  0925    PT Time Calculation (min)  56 min    Activity Tolerance  Patient tolerated treatment well;Patient limited by pain    Behavior During Therapy  [facility] for tasks assessed/performed       History reviewed. No pertinent past medical history.  History reviewed. No pertinent surgical history.  There were no vitals filed for this visit.  Subjective Assessment - 02/20/18 0831    Subjective  Pt. states she is doing better overall but having R elbow/upper arm discomfort with overhead reaching/ managing hair this morning.  Pt. reports being really active this past weekend in gardening/ canning vegtables.      Pertinent History  Pain began in Fall 2018 without MOI, but gradual onset.  Pain started on L side of neck and noticed she could not rotate her head to the L as far as the R, then noticed her R shoulder pain.  Bil carpal tunnel pain began around the same time.  She saw a chiropractor with some improvement overall, but with significant improvement in carpal tunnel symptoms.  Her R shoulder is painful if she does any reaching activity.  Pt has to use LUE to reach.  Pain is in anterolateral R upper arm.  Pt works from home and developed carpal tunnel BUEs after starting new job in the Fall which requires more typing.  Pt denies pain in LUE.  Pt reports she has some intermittent tingling/shooting pain that shoots down her R arm when she is doing household activities.  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  5 days/wk, 8 hrs/day on her computer for work (work from home for [employer]).  Pt denies numbness/tingling in either LE, weakness in either LE, bowel/bladder changes, night sweats, sudden changes in weight.  Goals: To be able to pick up coffee jar and put dishes away without pain, to be able to put her hair up without pain, reach for a cup/jar in front of her without pain.  Current pain: 6/10, best pain: 0/10, worst pain: 8/10. Denies neck or shoulder surgery or injury.  Pt is L handed.  No MOI; however, pt does report she built a pond last summer and was carrying lots of heavy rocks repetitively.     Diagnostic tests  X-ray on 11/28/17 revealed minimal uncovertebral spurring is present at C4-5 and C5-6, right greater than left, no osseous foraminal narrowing is evident.    Patient Stated Goals  reaching without pain, putting up hair without pain    Currently in Pain?  Yes    Pain Score  2     Pain Location  Elbow    Pain Orientation  Right    Pain Descriptors / Indicators  Aching    Pain Type  Chronic pain        NDI: 2% self-perceived no disability.  QuickDASH: 11.4%.  There.ex.:  Reviewed HEP  Manual tx.  Supine R/L shoulder stretches (all planes of movement)- gentle distraction/ oscillations at tolerable end-range.   Supine L/R UT/ levator/ rotation stretches (3x each with static holds).   Prone STM to upper thoracic/cervical region and grade II-III PA  Mobs. (unilateral/ central) 2x 15 sec each.   Seated scapular/ shoulder reassessment.   Prone dry needling to R/L UT region/ R posterior deltoid trigger points.  4 needles used with R UT muscle fasciculations noted.    Pt. Will continue with current HEP (strengthening ex.).     PT Education - 02/20/18 1329    Education provided  Yes    Education Details  Reviewed nerve glides/ HEP     Person(s) Educated  Patient    Methods  Explanation;Demonstration    Comprehension  Verbalized  understanding;Returned demonstration       PT Short Term Goals - 02/20/18 1330      PT SHORT TERM GOAL #1   Title  Pt will independently complete HEP at least 4 days/wk for decreased pain    Time  2    Period  Weeks    Status  Achieved    Target Date  01/23/18        PT Long Term Goals - 02/20/18 1330      PT LONG TERM GOAL #1   Title  Pt will be able to put up her hair into ponytail without pain in R shoulder    Time  3    Period  Weeks    Status  Achieved    Target Date  01/23/18      PT LONG TERM GOAL #2   Title  Pt will be able to perform reaching activities (unloading dishwasher, reaching for cup in cabinet)    Baseline  marked improvement with slight discomfort    Time  4    Period  Weeks    Status  Achieved    Target Date  02/20/18      PT LONG TERM GOAL #3   Title  Pt's worst pain will decrease to at least 4/10 for improved QOL    Baseline  2/10    Time  5    Period  Weeks    Status  Achieved    Target Date  02/20/18      PT LONG TERM GOAL #4   Title  Pt's R shoulder AROM will improve to WNL and painfree to allow for full functional use of RUE    Baseline  Slight pain with end-range R shoulder flexion.      Time  4    Period  Weeks    Status  Achieved    Target Date  02/20/18            Plan - 02/20/18 1329    Clinical Presentation  Stable    Clinical Decision Making  Low    Rehab Potential  Good    PT Frequency  1x / week    PT Duration  6 weeks    PT Treatment/Interventions  ADLs/Self Care Home Management;Biofeedback;Cryotherapy;Electrical Stimulation;Iontophoresis 4mg /ml Dexamethasone;Moist Heat;Traction;Ultrasound;Fluidtherapy;Contrast Bath;DME Instruction;Gait training;Stair training;Functional mobility training;Therapeutic activities;Therapeutic exercise;Balance training;Neuromuscular re-education;Patient/family education;Manual techniques;Passive range of motion;Dry needling;Energy conservation;Taping    PT Next Visit Plan  Discharge  visit.  Pt. instructed to contact PT if any changes in progress over next several weeks.      PT Home Exercise Plan  instructed pt to focus on proper posture throughout her  day (especially with work activities)    Oncologist with Plan of Care  Patient       Patient will benefit from skilled therapeutic intervention in order to improve the following deficits and impairments:  Decreased coordination, Decreased endurance, Decreased mobility, Decreased range of motion, Decreased strength, Hypomobility, Increased fascial restricitons, Increased muscle spasms, Impaired perceived functional ability, Impaired flexibility, Impaired sensation, Impaired UE functional use, Improper body mechanics, Postural dysfunction, Pain  Visit Diagnosis: Chronic right shoulder pain  Cervicalgia  Abnormal posture     Problem List Patient Active Problem List   Diagnosis Date Noted  . Depression 10/17/2015  . ADHD (attention deficit hyperactivity disorder) 01/30/2013  . Acute upper respiratory infection 01/30/2009  . PEPTIC ULCER DISEASE 10/02/2007  . SORE THROAT 06/27/2007  . URTICARIA 06/27/2007   Pura Spice, PT, DPT # [id number] 02/20/2018, 1:32 PM  [place] [facility] [address] [place], [place], [address] Phone: [phone]   Fax:  [phone]  Name: Courtney Schultz MRN: [id number] Date of Birth: [birth year]

## 2018-05-29 ENCOUNTER — Ambulatory Visit: Payer: BLUE CROSS/BLUE SHIELD | Admitting: Family Medicine

## 2018-05-30 ENCOUNTER — Encounter: Payer: Self-pay | Admitting: Family Medicine

## 2018-05-30 ENCOUNTER — Ambulatory Visit (INDEPENDENT_AMBULATORY_CARE_PROVIDER_SITE_OTHER): Payer: BLUE CROSS/BLUE SHIELD | Admitting: Family Medicine

## 2018-05-30 VITALS — BP 136/88 | HR 60 | Temp 98.0°F | Ht 66.0 in | Wt 168.4 lb

## 2018-05-30 DIAGNOSIS — Z0000 Encounter for general adult medical examination without abnormal findings: Secondary | ICD-10-CM | POA: Diagnosis not present

## 2018-05-30 DIAGNOSIS — Z23 Encounter for immunization: Secondary | ICD-10-CM | POA: Diagnosis not present

## 2018-05-30 DIAGNOSIS — Z1211 Encounter for screening for malignant neoplasm of colon: Secondary | ICD-10-CM | POA: Diagnosis not present

## 2018-05-30 DIAGNOSIS — J029 Acute pharyngitis, unspecified: Secondary | ICD-10-CM | POA: Diagnosis not present

## 2018-05-30 DIAGNOSIS — Z1231 Encounter for screening mammogram for malignant neoplasm of breast: Secondary | ICD-10-CM | POA: Diagnosis not present

## 2018-05-30 LAB — COMPREHENSIVE METABOLIC PANEL
ALBUMIN: 4.5 g/dL (ref 3.5–5.2)
ALT: 13 U/L (ref 0–35)
AST: 18 U/L (ref 0–37)
Alkaline Phosphatase: 64 U/L (ref 39–117)
BUN: 19 mg/dL (ref 6–23)
CALCIUM: 10.1 mg/dL (ref 8.4–10.5)
CO2: 30 meq/L (ref 19–32)
CREATININE: 0.86 mg/dL (ref 0.40–1.20)
Chloride: 104 mEq/L (ref 96–112)
GFR: 74 mL/min (ref >60.00)
Glucose, Bld: 92 mg/dL (ref 70–99)
Potassium: 4.2 mEq/L (ref 3.5–5.1)
Sodium: 139 mEq/L (ref 135–145)
Total Bilirubin: 0.6 mg/dL (ref 0.2–1.2)
Total Protein: 7.6 g/dL (ref 6.0–8.3)

## 2018-05-30 LAB — CBC WITH DIFFERENTIAL/PLATELET
Basophils Absolute: 0 10*3/uL (ref 0.0–0.1)
Basophils Relative: 0.4 % (ref 0.0–3.0)
EOS PCT: 0.5 % (ref 0.0–5.0)
Eosinophils Absolute: 0 10*3/uL (ref 0.0–0.7)
HEMATOCRIT: 41.4 % (ref 36.0–46.0)
HEMOGLOBIN: 14 g/dL (ref 12.0–15.0)
Lymphocytes Relative: 29.6 % (ref 12.0–46.0)
Lymphs Abs: 2.1 10*3/uL (ref 0.7–4.0)
MCHC: 33.8 g/dL (ref 30.0–36.0)
MCV: 94.9 fl (ref 78.0–100.0)
MONOS PCT: 3.9 % (ref 3.0–12.0)
Monocytes Absolute: 0.3 10*3/uL (ref 0.1–1.0)
Neutro Abs: 4.6 10*3/uL (ref 1.4–7.7)
Neutrophils Relative %: 65.6 % (ref 43.0–77.0)
PLATELETS: 296 10*3/uL (ref 150.0–400.0)
RBC: 4.37 Mil/uL (ref 3.87–5.11)
RDW: 12.9 % (ref 11.5–15.5)
WBC: 7 10*3/uL (ref 4.0–10.5)

## 2018-05-30 LAB — LIPID PANEL
CHOLESTEROL: 237 mg/dL — AB (ref 0–200)
HDL: 88 mg/dL (ref >39.00)
LDL CALC: 135 mg/dL — AB (ref 0–99)
NonHDL: 149.47
TRIGLYCERIDES: 70 mg/dL (ref 0.0–149.0)
Total CHOL/HDL Ratio: 3
VLDL: 14 mg/dL (ref 0.0–40.0)

## 2018-05-30 LAB — TSH: TSH: 1.78 u[IU]/mL (ref 0.35–4.50)

## 2018-05-30 LAB — POCT RAPID STREP A (OFFICE): Rapid Strep A Screen: NEGATIVE

## 2018-05-30 NOTE — Progress Notes (Signed)
Subjective:    Patient ID: Courtney Schultz, female    DOB: June 11, 1966, 52 y.o.   MRN: [id number]  HPI  Presents to clinic as a TOC from Dr Burnice Logan & c/o an annoying sore throat for about a month.    Currently she takes no medications.  She denies any fever or chills.  She denies difficulty swallowing.  Denies any drooling.  Denies chest pain, shortness of breath, palpitations, feeling faint or dizzy.  Denies any heartburn, nausea, vomiting or diarrhea.  Patient is due for her mammogram and colonoscopy.  Last Pap smear was approximately 2 to 3 years ago.  Patient would like to hold off on Pap smear at this time.  She sees the dentist 1-2 times per year.  Gets eye exam every couple of years.  She tries to eat a healthy diet and get regular exercise.  She works from home, and has to travel to [place] on occasion for work meetings.  Past medical history, social history, surgical history and family history reviewed and updated in chart.  Patient Active Problem List   Diagnosis Date Noted  . Depression 10/17/2015  . ADHD (attention deficit hyperactivity disorder) 01/30/2013  . Acute upper respiratory infection 01/30/2009  . PEPTIC ULCER DISEASE 10/02/2007  . SORE THROAT 06/27/2007  . 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 Smoking status: Former Smoker    Types: Cigarettes  . Smokeless tobacco: Never Used  Substance Use Topics  . Alcohol use: Yes    Alcohol/week: 7.0 standard drinks    Types: 7 Cans of beer per week   Past Surgical History:  Procedure Laterality Date  . BUNIONECTOMY     Family History  Problem Relation Age of Onset  . Diabetes Father   . Hypertension Father     Review of Systems  Constitutional: Negative for chills, fatigue and fever.  HENT: +sore throat Eyes: Negative.   Respiratory: Negative for cough, shortness of breath and wheezing.   Cardiovascular: Negative for chest pain, palpitations and leg swelling.  Gastrointestinal:  Negative for abdominal pain, diarrhea, nausea and vomiting.  Genitourinary: Negative for dysuria, frequency and urgency.  Musculoskeletal: Negative for arthralgias and myalgias.  Skin: Negative for color change, pallor and rash.  Neurological: Negative for syncope, light-headedness and headaches.  Psychiatric/Behavioral: The patient is not nervous/anxious.       Objective:   Physical Exam   Constitutional: She appears well-developed and well-nourished. No distress.  HENT:  Head: Normocephalic and atraumatic.  Eyes: Pupils are equal, round, and reactive to light. EOM are normal. No scleral icterus.  Nose/throat: +post nasal drip. Tonsils normal.  Neck: Normal range of motion. Neck supple. No tracheal deviation present.  Cardiovascular: Normal rate, regular rhythm and normal heart sounds.  Pulmonary/Chest: Effort normal and breath sounds normal. No respiratory distress. She has no wheezes. She has no rales.  Declines breast exam in clinic today, agreeable to mammogram. Abdominal: Soft. Bowel sounds are normal. There is no tenderness.  Neurological: She is alert and oriented to person, place, and time.  Gait normal  Skin: Skin is warm and dry. No pallor.  Psychiatric: She has a normal mood and affect. Her behavior is normal. Thought content normal.   Nursing note and vitals reviewed.   Vitals:   05/30/18 1125  BP: 136/88  Pulse: 60  Temp: 98 F (36.7 C)  SpO2: 99%      Assessment & Plan:   Wellness exam - patient will get  colonoscopy screening.  Mammogram ordered.  She got the flu vaccine and tetanus vaccine today in clinic.  We discussed healthy diet and regular exercise.  Lab work including CBC, CMP, TSH and lipid panel ordered for screening purposes.  Sore throat - patient does have some postnasal drip on exam.  It is possible sore throat is related to this.  Patient given instructions on how to do saline nasal flushes to help combat postnasal drip.  If saline nasal flushes  are not successful advised to try over-the-counter antihistamine such as Claritin.  Rapid strep collected in clinic which was negative.  Throat culture sent to lab to further evaluate for any other possible throat pain causes.  Follow-up in 1 year for annual exam, will plan to do Pap smear next year.  If any abnormalities found in lab work, they will be addressed accordingly.  If the sore throat persist even with use of saline nasal flushes and antihistamine, please return to clinic for reevaluation.

## 2018-05-30 NOTE — Patient Instructions (Signed)

## 2018-06-02 ENCOUNTER — Other Ambulatory Visit: Payer: Self-pay

## 2018-06-02 DIAGNOSIS — Z1211 Encounter for screening for malignant neoplasm of colon: Secondary | ICD-10-CM

## 2018-06-02 LAB — CULTURE, UPPER RESPIRATORY
MICRO NUMBER: 91238328
SPECIMEN QUALITY:: ADEQUATE

## 2018-06-07 ENCOUNTER — Ambulatory Visit: Payer: Self-pay

## 2018-06-07 NOTE — Telephone Encounter (Signed)
Patient called in with c/o "upper abdominal pain." She says "the pain started about 3-4 days ago with nausea, underneath her breast bone in the middle of the abdomen. There is a knot that is felt in the center. The pain is a 6-7." I asked about other symptoms, she says "just nausea and sweating. I have hot flashes, so don't know if I'm having a fever or not." According to protocol, see PCP within 3 days, appointment scheduled for tomorrow at 0800 with Philis Nettle, FNP, care advice given, patient verbalized understanding.  Reason for Disposition . [1] MODERATE pain (e.g., interferes with normal activities) AND [2] comes and goes (cramps) AND [3] present > 24 hours  (Exception: pain with Vomiting or Diarrhea - see that Guideline)  Answer Assessment - Initial Assessment Questions 1. LOCATION: "Where does it hurt?"      Upper abdomen in the middle underneath breast bone 2. RADIATION: "Does the pain shoot anywhere else?" (e.g., chest, back)     No 3. ONSET: "When did the pain begin?" (e.g., minutes, hours or days ago)      3-4 days ago 4. SUDDEN: "Gradual or sudden onset?"     I don't really know 5. PATTERN "Does the pain come and go, or is it constant?"    - If constant: "Is it getting better, staying the same, or worsening?"      (Note: Constant means the pain never goes away completely; most serious pain is constant and it progresses)     - If intermittent: "How long does it last?" "Do you have pain now?"     (Note: Intermittent means the pain goes away completely between bouts)     Come and go, constant tightness 6. SEVERITY: "How bad is the pain?"  (e.g., Scale 1-10; mild, moderate, or severe)    - MILD (1-3): doesn't interfere with normal activities, abdomen soft and not tender to touch     - MODERATE (4-7): interferes with normal activities or awakens from sleep, tender to touch     - SEVERE (8-10): excruciating pain, doubled over, unable to do any normal activities       6-7 7. RECURRENT  SYMPTOM: "Have you ever had this type of abdominal pain before?" If so, ask: "When was the last time?" and "What happened that time?"      No 8. AGGRAVATING FACTORS: "Does anything seem to cause this pain?" (e.g., foods, stress, alcohol)     No 9. CARDIAC SYMPTOMS: "Do you have any of the following symptoms: chest pain, difficulty breathing, sweating, nausea?"     Nausea, sweating 10. OTHER SYMPTOMS: "Do you have any other symptoms?" (e.g., fever, vomiting, diarrhea)       Not sure about the fever 11. PREGNANCY: "Is there any chance you are pregnant?" "When was your last menstrual period?"       No  Protocols used: ABDOMINAL PAIN - UPPER-A-AH

## 2018-06-08 ENCOUNTER — Ambulatory Visit (INDEPENDENT_AMBULATORY_CARE_PROVIDER_SITE_OTHER): Payer: BLUE CROSS/BLUE SHIELD | Admitting: Family Medicine

## 2018-06-08 ENCOUNTER — Encounter: Payer: Self-pay | Admitting: Family Medicine

## 2018-06-08 VITALS — BP 120/76 | HR 70 | Temp 97.7°F | Ht 66.0 in | Wt 166.2 lb

## 2018-06-08 DIAGNOSIS — Z91018 Allergy to other foods: Secondary | ICD-10-CM | POA: Diagnosis not present

## 2018-06-08 DIAGNOSIS — R5383 Other fatigue: Secondary | ICD-10-CM

## 2018-06-08 DIAGNOSIS — R110 Nausea: Secondary | ICD-10-CM | POA: Diagnosis not present

## 2018-06-08 DIAGNOSIS — R1012 Left upper quadrant pain: Secondary | ICD-10-CM

## 2018-06-08 DIAGNOSIS — R1011 Right upper quadrant pain: Secondary | ICD-10-CM

## 2018-06-08 LAB — B12 AND FOLATE PANEL
Folate: >23.9 ng/mL (ref >5.9)
Vitamin B-12: 254 pg/mL (ref 211–911)

## 2018-06-08 LAB — AMYLASE: AMYLASE: 33 U/L (ref 27–131)

## 2018-06-08 LAB — VITAMIN D 25 HYDROXY (VIT D DEFICIENCY, FRACTURES): VITD: 31.99 ng/mL (ref 30.00–100.00)

## 2018-06-08 LAB — LIPASE: LIPASE: 16 U/L (ref 11.0–59.0)

## 2018-06-08 NOTE — Progress Notes (Signed)
Subjective:    Patient ID: Courtney Schultz, female    DOB: 07/27/1966, 52 y.o.   MRN: [id number]  HPI   Patient presents to clinic due to upper abdominal pain for past week.  Patient describes the pain as nauseousness, tenderness, seems to be worse while laying in bed, but will also have bouts throughout the day.  Also states a friend told her that her breath smells like sulfur.  Patient has had heartburn in the past, but she has combativeness with drinking lots of water and also a licorice supplement.  Patient has history of meat allergy, would like alpha gal panel retested due to this being positive in the past. Patient also states she has felt more tired over the past week, labs done 2 weeks ago show no anemia, no electrolyte abnormality and normal TSH.   Patient Active Problem List   Diagnosis Date Noted  . Depression 10/17/2015  . ADHD (attention deficit hyperactivity disorder) 01/30/2013  . Acute upper respiratory infection 01/30/2009  . PEPTIC ULCER DISEASE 10/02/2007  . SORE THROAT 06/27/2007  . 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 Smoking status: Former Smoker    Types: Cigarettes  . Smokeless tobacco: Never Used  Substance Use Topics  . Alcohol use: Yes    Alcohol/week: 7.0 standard drinks    Types: 7 Cans of beer per week   Review of Systems   Constitutional: +fatigue. Negative for chills, fever.  HENT: Negative for congestion, ear pain, sinus pain and sore throat.   Eyes: Negative.   Respiratory: Negative for cough, shortness of breath and wheezing.   Cardiovascular: Negative for chest pain, palpitations and leg swelling.  Gastrointestinal: +upper ABD pain and nausea. Negative for diarrhea, vomiting.  Genitourinary: Negative for dysuria, frequency and urgency.  Musculoskeletal: Negative for arthralgias and myalgias.  Skin: Negative for color change, pallor and rash.  Neurological: Negative for syncope, light-headedness and headaches.    Psychiatric/Behavioral: The patient is not nervous/anxious.       Objective:   Physical Exam  Constitutional: She is oriented to person, place, and time. She appears well-nourished.  Non-toxic appearance. She does not appear ill. No distress.  HENT:  Head: Normocephalic and atraumatic.  Mouth/Throat: Oropharynx is clear and moist.  Eyes: Pupils are equal, round, and reactive to light. EOM are normal. No scleral icterus.  Pulmonary/Chest: Effort normal and breath sounds normal. No respiratory distress. She has no wheezes. She has no rhonchi. She has no rales.  Abdominal: Soft. Normal appearance and bowel sounds are normal. She exhibits no distension and no mass. There is no splenomegaly or hepatomegaly. There is no tenderness. There is no rigidity, no rebound and no guarding.  Neurological: She is alert and oriented to person, place, and time.  Skin: Skin is warm and dry. No pallor.  Psychiatric: She has a normal mood and affect. Her behavior is normal.  Nursing note and vitals reviewed.     Vitals:   06/08/18 0810  BP: 120/76  Pulse: 70  Temp: 97.7 F (36.5 C)  SpO2: 99%    Assessment & Plan:    Upper abdominal pain/nausea - we will get lab work including lipase and amylase to further evaluate.  We will also do abdominal ultrasound for imaging of abdominal organs.  I suggested to patient that she try taking an antacid medication once daily like Pepcid 20 mg to see if this helps calm any of her symptoms.  Fatigue - most recent  labs done a couple weeks ago do not show any anemia, electrolyte imbalances or thyroid issues.  We will do vitamin D and B12/folic acid levels to further evaluate.  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  Results of lab work and imaging will determine next step in our plan of care.  Patient advised if pain becomes increasingly worse to call office and  let us know and or go to emergency department for evaluation.

## 2018-06-12 LAB — ALPHA-GAL PANEL
Beef IgE: 13.9 kU/L — ABNORMAL HIGH (ref <0.35)
CLASS: 2
CLASS: 3
Class: 3
GALACTOSE-ALPHA-1, 3-GALACTOSE IGE: 22.5 kU/L — AB (ref <0.10)
LAMB/MUTTON IGE: 2.4 kU/L — ABNORMAL HIGH (ref <0.35)
Pork IgE: 8.11 kU/L — ABNORMAL HIGH (ref <0.35)

## 2018-06-19 ENCOUNTER — Ambulatory Visit: Payer: BLUE CROSS/BLUE SHIELD

## 2018-06-23 DIAGNOSIS — M1712 Unilateral primary osteoarthritis, left knee: Secondary | ICD-10-CM | POA: Diagnosis not present

## 2018-07-26 DIAGNOSIS — N3281 Overactive bladder: Secondary | ICD-10-CM | POA: Diagnosis not present

## 2018-07-26 DIAGNOSIS — R232 Flushing: Secondary | ICD-10-CM | POA: Diagnosis not present

## 2018-07-26 DIAGNOSIS — Z124 Encounter for screening for malignant neoplasm of cervix: Secondary | ICD-10-CM | POA: Diagnosis not present

## 2018-07-26 DIAGNOSIS — Z1239 Encounter for other screening for malignant neoplasm of breast: Secondary | ICD-10-CM | POA: Diagnosis not present

## 2018-07-26 DIAGNOSIS — Z01419 Encounter for gynecological examination (general) (routine) without abnormal findings: Secondary | ICD-10-CM | POA: Diagnosis not present

## 2018-08-01 ENCOUNTER — Ambulatory Visit
Admission: RE | Admit: 2018-08-01 | Discharge: 2018-08-01 | Disposition: A | Discharge status: Home / Self Care | Payer: BLUE CROSS/BLUE SHIELD | Source: Ambulatory Visit | Attending: Family Medicine | Admitting: Family Medicine

## 2018-08-01 DIAGNOSIS — Z1231 Encounter for screening mammogram for malignant neoplasm of breast: Secondary | ICD-10-CM | POA: Diagnosis not present

## 2018-08-31 ENCOUNTER — Ambulatory Visit
Admission: RE | Admit: 2018-08-31 | Discharge: 2018-08-31 | Disposition: A | Discharge status: Home / Self Care | Payer: BLUE CROSS/BLUE SHIELD | Attending: Gastroenterology | Admitting: Gastroenterology

## 2018-08-31 ENCOUNTER — Ambulatory Visit: Payer: BLUE CROSS/BLUE SHIELD | Admitting: Certified Registered"

## 2018-08-31 ENCOUNTER — Encounter: Payer: Self-pay | Admitting: *Deleted

## 2018-08-31 ENCOUNTER — Encounter
Admission: RE | Disposition: A | Discharge status: Home / Self Care | Payer: Self-pay | Source: Home / Self Care | Attending: Gastroenterology

## 2018-08-31 DIAGNOSIS — K5730 Diverticulosis of large intestine without perforation or abscess without bleeding: Secondary | ICD-10-CM | POA: Insufficient documentation

## 2018-08-31 DIAGNOSIS — Z87891 Personal history of nicotine dependence: Secondary | ICD-10-CM | POA: Insufficient documentation

## 2018-08-31 DIAGNOSIS — K5790 Diverticulosis of intestine, part unspecified, without perforation or abscess without bleeding: Secondary | ICD-10-CM | POA: Diagnosis not present

## 2018-08-31 DIAGNOSIS — Z1211 Encounter for screening for malignant neoplasm of colon: Secondary | ICD-10-CM

## 2018-08-31 DIAGNOSIS — K635 Polyp of colon: Secondary | ICD-10-CM | POA: Diagnosis not present

## 2018-08-31 DIAGNOSIS — Z880 Allergy status to penicillin: Secondary | ICD-10-CM | POA: Diagnosis not present

## 2018-08-31 DIAGNOSIS — D124 Benign neoplasm of descending colon: Secondary | ICD-10-CM | POA: Diagnosis not present

## 2018-08-31 HISTORY — PX: COLONOSCOPY WITH PROPOFOL: SHX5780

## 2018-08-31 LAB — POCT PREGNANCY, URINE: Preg Test, Ur: NEGATIVE

## 2018-08-31 SURGERY — COLONOSCOPY WITH PROPOFOL
Anesthesia: General

## 2018-08-31 MED ORDER — PROPOFOL 500 MG/50ML IV EMUL
INTRAVENOUS | Status: AC
  Filled 2018-08-31: qty 50

## 2018-08-31 MED ORDER — PROPOFOL 500 MG/50ML IV EMUL
INTRAVENOUS | Status: DC | PRN
  Administered 2018-08-31: 150 ug/kg/min via INTRAVENOUS

## 2018-08-31 MED ORDER — PROPOFOL 10 MG/ML IV BOLUS
INTRAVENOUS | Status: DC | PRN
  Administered 2018-08-31 (×2): 50 mg via INTRAVENOUS
  Administered 2018-08-31: 20 mg via INTRAVENOUS

## 2018-08-31 MED ORDER — EPHEDRINE SULFATE 50 MG/ML IJ SOLN
INTRAMUSCULAR | Status: DC | PRN
  Administered 2018-08-31: 10 mg via INTRAVENOUS

## 2018-08-31 MED ORDER — MIDAZOLAM HCL 2 MG/2ML IJ SOLN
INTRAMUSCULAR | Status: DC | PRN
  Administered 2018-08-31: 2 mg via INTRAVENOUS

## 2018-08-31 MED ORDER — PROPOFOL 10 MG/ML IV BOLUS
INTRAVENOUS | Status: AC
  Filled 2018-08-31: qty 20

## 2018-08-31 MED ORDER — MIDAZOLAM HCL 2 MG/2ML IJ SOLN
INTRAMUSCULAR | Status: AC
  Filled 2018-08-31: qty 2

## 2018-08-31 MED ORDER — SODIUM CHLORIDE 0.9 % IV SOLN
INTRAVENOUS | Status: DC
  Administered 2018-08-31: 1000 mL via INTRAVENOUS

## 2018-08-31 NOTE — Op Note (Signed)
[facility] Patient Name: Courtney Schultz Procedure Date: 08/31/2018 8:01 AM MRN: [id number] Account #: [id number] Date of Birth: 03-05-1966 Admit Type: Outpatient Age: 53 Room: [facility] ENDO ROOM 2 Gender: Female Note Status: Finalized Procedure:            Colonoscopy Indications:          Screening for colorectal malignant neoplasm, This is                        the patient's first colonoscopy Providers:            Lin Landsman MD, MD Referring MD:         No Local Md, MD (Referring MD) Medicines:            Monitored Anesthesia Care Complications:        No immediate complications. Estimated blood loss: None. Procedure:            Pre-Anesthesia Assessment:                       - Prior to the procedure, a History and Physical was                        performed, and patient medications and allergies were                        reviewed. The patient is competent. The risks and                        benefits of the procedure and the sedation options and                        risks were discussed with the patient. All questions                        were answered and informed consent was obtained.                        Patient identification and proposed procedure were                        verified by the physician, the nurse, the                        anesthesiologist, the anesthetist and the technician in                        the pre-procedure area in the procedure room in the                        endoscopy suite. Mental Status Examination: alert and                        oriented. Airway Examination: normal oropharyngeal                        airway and neck mobility. Respiratory Examination:                        clear to auscultation. CV Examination: normal.  Prophylactic Antibiotics: The patient does not require                        prophylactic antibiotics. Prior Anticoagulants: The          patient has taken no previous anticoagulant or                        antiplatelet agents. ASA Grade Assessment: II - A                        patient with mild systemic disease. After reviewing the                        risks and benefits, the patient was deemed in                        satisfactory condition to undergo the procedure. The                        anesthesia plan was to use monitored anesthesia care                        (MAC). Immediately prior to administration of                        medications, the patient was re-assessed for adequacy                        to receive sedatives. The heart rate, respiratory rate,                        oxygen saturations, blood pressure, adequacy of                        pulmonary ventilation, and response to care were                        monitored throughout the procedure. The physical status                        of the patient was re-assessed after the procedure.                       After obtaining informed consent, the colonoscope was                        passed under direct vision. Throughout the procedure,                        the patient's blood pressure, pulse, and oxygen                        saturations were monitored continuously. The                        Colonoscope was introduced through the anus and                        advanced to the the cecum, identified by appendiceal  orifice and ileocecal valve. The colonoscopy was                        somewhat difficult due to multiple diverticula in the                        colon. Successful completion of the procedure was aided                        by applying abdominal pressure. The patient tolerated                        the procedure well. The quality of the bowel                        preparation was evaluated using the BBPS [facility] Bowel                        Preparation Scale) with scores of: Right Colon = 3,                         Transverse Colon = 3 and Left Colon = 3 (entire mucosa                        seen well with no residual staining, small fragments of                        stool or opaque liquid). The total BBPS score equals 9. Findings:      The perianal and digital rectal examinations were normal. Pertinent       negatives include normal sphincter tone and no palpable rectal lesions.      A 5 mm polyp was found in the descending colon. The polyp was sessile.       The polyp was removed with a cold snare. Resection and retrieval were       complete.      A few diverticula were found in the sigmoid colon and ascending colon.      The retroflexed view of the distal rectum and anal verge was normal and       showed no anal or rectal abnormalities.      The exam was otherwise without abnormality. Impression:           - One 5 mm polyp in the descending colon, removed with                        a cold snare. Resected and retrieved.                       - Diverticulosis in the sigmoid colon and in the                        ascending colon.                       - The distal rectum and anal verge are normal on                        retroflexion view.                       -  The examination was otherwise normal. Recommendation:       - Discharge patient to home (with escort).                       - Resume regular diet.                       - Continue present medications.                       - Await pathology results.                       - Repeat colonoscopy in 5-10 years for surveillance                        based on pathology results. Procedure Code(s):    --- Professional ---                       [phone], Colonoscopy, flexible; with removal of tumor(s),                        polyp(s), or other lesion(s) by snare technique Diagnosis Code(s):    --- Professional ---                       Z12.11, Encounter for screening for malignant neoplasm                        of colon                        D12.4, Benign neoplasm of descending colon                       K57.30, Diverticulosis of large intestine without                        perforation or abscess without bleeding CPT copyright 2018 American Medical Association. All rights reserved. The codes documented in this report are preliminary and upon coder review may  be revised to meet current compliance requirements. Dr. Ulyess Mort Lin Landsman MD, MD 08/31/2018 8:48:46 AM This report has been signed electronically. Number of Addenda: 0 Note Initiated On: 08/31/2018 8:01 AM Scope Withdrawal Time: 0 hours 13 minutes 36 seconds  Total Procedure Duration: 0 hours 17 minutes 36 seconds       [facility]

## 2018-08-31 NOTE — Anesthesia Preprocedure Evaluation (Deleted)
Anesthesia Evaluation    Airway        Dental   Pulmonary former smoker,          Cardiovascular     Neuro/Psych    GI/Hepatic   Endo/Other    Renal/GU      Musculoskeletal   Abdominal   Peds  Hematology   Anesthesia Other Findings   Reproductive/Obstetrics                             Anesthesia Physical Anesthesia Plan Anesthesia Quick Evaluation  

## 2018-08-31 NOTE — Anesthesia Post-op Follow-up Note (Signed)
Anesthesia QCDR form completed.        

## 2018-08-31 NOTE — Transfer of Care (Signed)
Immediate Anesthesia Transfer of Care Note  Patient: Courtney Schultz  Procedure(s) Performed: COLONOSCOPY WITH PROPOFOL (N/A )  Patient Location: Endoscopy Unit  Anesthesia Type:General  Level of Consciousness: drowsy and patient cooperative  Airway & Oxygen Therapy: Patient connected to nasal cannula oxygen  Post-op Assessment: Report given to RN and Post -op Vital signs reviewed and stable  Post vital signs: Reviewed and stable  Last Vitals:  Vitals Value Taken Time  BP 123/69 08/31/2018  8:50 AM  Temp 36.1 C 08/31/2018  8:49 AM  Pulse 66 08/31/2018  8:51 AM  Resp 14 08/31/2018  8:51 AM  SpO2 100 % 08/31/2018  8:51 AM  Vitals shown include unvalidated device data.  Last Pain:  Vitals:   08/31/18 0849  TempSrc: Tympanic  PainSc: Asleep         Complications: No apparent anesthesia complications

## 2018-08-31 NOTE — Anesthesia Preprocedure Evaluation (Signed)
Anesthesia Evaluation  Patient identified by MRN, date of birth, ID band Patient awake    Reviewed: Allergy & Precautions, H&P , NPO status , Patient's Chart, lab work & pertinent test results, reviewed documented beta blocker date and time   Airway Mallampati: II   Neck ROM: full    Dental  (+) Poor Dentition   Pulmonary neg pulmonary ROS, former smoker,    Pulmonary exam normal        Cardiovascular Exercise Tolerance: Good negative cardio ROS Normal cardiovascular exam Rhythm:regular Rate:Normal     Neuro/Psych PSYCHIATRIC DISORDERS Depression negative neurological ROS     GI/Hepatic Neg liver ROS, PUD,   Endo/Other  negative endocrine ROS  Renal/GU negative Renal ROS  negative genitourinary   Musculoskeletal   Abdominal   Peds  Hematology negative hematology ROS (+)   Anesthesia Other Findings History reviewed. No pertinent past medical history. Past Surgical History: No date: BUNIONECTOMY BMI    Body Mass Index:  26.63 kg/m     Reproductive/Obstetrics negative OB ROS                             Anesthesia Physical Anesthesia Plan  ASA: II  Anesthesia Plan: General   Post-op Pain Management:    Induction:   PONV Risk Score and Plan:   Airway Management Planned:   Additional Equipment:   Intra-op Plan:   Post-operative Plan:   Informed Consent: I have reviewed the patients History and Physical, chart, labs and discussed the procedure including the risks, benefits and alternatives for the proposed anesthesia with the patient or authorized representative who has indicated his/her understanding and acceptance.     Dental Advisory Given  Plan Discussed with: CRNA  Anesthesia Plan Comments:         Anesthesia Quick Evaluation

## 2018-08-31 NOTE — H&P (Signed)
Cephas Darby, MD [place]  [place],  [id number]  Main: [phone]  Fax: [phone] Pager: [phone]  Primary Care Physician:  [name], FNP Primary Gastroenterologist:  Dr. Cephas Darby  Pre-Procedure History & Physical: HPI:  Courtney Schultz is a 53 y.o. female is here for an colonoscopy.   History reviewed. No pertinent past medical history.  Past Surgical History:  Procedure Laterality Date  . BUNIONECTOMY      Prior to Admission medications   Not on File    Allergies as of 06/02/2018 - Review Complete 05/30/2018  Allergen Reaction Noted  . Penicillins Anaphylaxis 05/10/2007    Family History  Problem Relation Age of Onset  . Breast cancer Mother 41  . Diabetes Father   . Hypertension Father     Social History   Socioeconomic History  . Marital status: Single    Spouse name: Not on file  . Number of children: Not on file  . Years of education: Not on file  . Highest education level: Not on file  Occupational History  . Not on file  Social Needs  . Financial resource strain: Not on file  . Food insecurity:    Worry: Not on file    Inability: Not on file  . Transportation needs:    Medical: Not on file    Non-medical: Not on file  Tobacco Use  . Smoking status: Former Smoker    Types: Cigarettes  . Smokeless tobacco: Never Used  Substance and Sexual Activity  . Alcohol use: Yes    Alcohol/week: 7.0 standard drinks    Types: 7 Cans of beer per week  . Drug use: No  . Sexual activity: Yes    Birth control/protection: I.U.D.  Lifestyle  . Physical activity:    Days per week: Not on file    Minutes per session: Not on file  . Stress: Not on file  Relationships  . Social connections:    Talks on phone: Not on file    Gets together: Not on file    Attends religious service: Not on file    Active member of club or organization: Not on file    Attends meetings of clubs or organizations: Not on file   Relationship status: Not on file  . Intimate partner violence:    Fear of current or ex partner: Not on file    Emotionally abused: Not on file    Physically abused: Not on file    Forced sexual activity: Not on file  Other Topics Concern  . Not on file  Social History Narrative  . Not on file    Review of Systems: See HPI, otherwise negative ROS  Physical Exam: BP 122/79   Pulse (!) 55   Temp (!) 96.6 F (35.9 C) (Tympanic)   Resp 16   Ht 5\' 6"  (1.676 m)   Wt 74.8 kg   LMP  (LMP Unknown)   SpO2 100%   BMI 26.63 kg/m  General:   Alert,  pleasant and cooperative in NAD Head:  Normocephalic and atraumatic. Neck:  Supple; no masses or thyromegaly. Lungs:  Clear throughout to auscultation.    Heart:  Regular rate and rhythm. Abdomen:  Soft, nontender and nondistended. Normal bowel sounds, without guarding, and without rebound.   Neurologic:  Alert and  oriented x4;  grossly normal neurologically.  Impression/Plan: Courtney Schultz is here for an colonoscopy to be performed for colon cancer screening  Risks, benefits, limitations, and alternatives regarding  colonoscopy have been reviewed with the patient.  Questions have been answered.  All parties agreeable.   Sherri Sear, MD  08/31/2018, 8:23 AM

## 2018-09-01 LAB — SURGICAL PATHOLOGY

## 2018-09-01 NOTE — Anesthesia Postprocedure Evaluation (Signed)
Anesthesia Post Note  Patient: ADAH STONEBERG  Procedure(s) Performed: COLONOSCOPY WITH PROPOFOL (N/A )  Patient location during evaluation: PACU Anesthesia Type: General Level of consciousness: awake and alert Pain management: pain level controlled Vital Signs Assessment: post-procedure vital signs reviewed and stable Respiratory status: spontaneous breathing, nonlabored ventilation, respiratory function stable and patient connected to nasal cannula oxygen Cardiovascular status: blood pressure returned to baseline and stable Postop Assessment: no apparent nausea or vomiting Anesthetic complications: no     Last Vitals:  Vitals:   08/31/18 0909 08/31/18 0919  BP: 113/80 125/75  Pulse:    Resp:    Temp:    SpO2:      Last Pain:  Vitals:   08/31/18 0919  TempSrc:   PainSc: 0-No pain                 Molli Barrows

## 2018-09-04 ENCOUNTER — Encounter: Payer: Self-pay | Admitting: Gastroenterology

## 2018-10-30 ENCOUNTER — Encounter: Payer: Self-pay | Admitting: Family Medicine

## 2018-10-30 DIAGNOSIS — T783XXA Angioneurotic edema, initial encounter: Secondary | ICD-10-CM

## 2018-11-01 NOTE — Telephone Encounter (Signed)
Sent to PCP to advise   Last OV 06/08/2018   Nothing is listed on pt's current medication list

## 2018-11-02 ENCOUNTER — Telehealth: Payer: Self-pay | Admitting: Lab

## 2018-11-02 MED ORDER — EPINEPHRINE 0.3 MG/0.3ML IJ SOAJ: 0.3000 mg | INTRAMUSCULAR | 5 refills | Status: AC | PRN

## 2018-11-02 NOTE — Telephone Encounter (Signed)
Please call to set up appt to discuss mood/effexor  Epi pen sent in

## 2018-11-02 NOTE — Telephone Encounter (Signed)
Called Pt No answer, left a VM to call office to schedule a Office visit for Follow-Up appt with Philis Nettle for Medication

## 2018-11-02 NOTE — Telephone Encounter (Signed)
Called Pt and scheduled her an appt with NP Philis Nettle. For  12/04/2018 Follow-up on Medication.

## 2018-11-13 ENCOUNTER — Encounter: Payer: Self-pay | Admitting: Family Medicine

## 2018-11-17 ENCOUNTER — Telehealth: Payer: Self-pay | Admitting: Lab

## 2018-11-17 NOTE — Telephone Encounter (Signed)
Called Pt No answer left a VM to call office. Will try back later  I was calling Pt to see if she would like a sooner appt which would be a Virtual visit with Philis Nettle FNP if she was interested, and we have several open slots. Okay if Pec speaks to Pt

## 2018-11-17 NOTE — Telephone Encounter (Signed)
I also sent pt a my chart message asking date/time preferred to set up virtual visit

## 2018-11-17 NOTE — Telephone Encounter (Signed)
This was routed back to me with no new information -- she can be set up for web ex visit  We can do this today or next week, whichever patient prefers

## 2018-11-17 NOTE — Telephone Encounter (Signed)
Called Pt and scheduled her a Virtual visit for 11/20/2018 @8 :20 am Med F/U

## 2018-11-17 NOTE — Telephone Encounter (Signed)
Patient interested in being set up for virtual visit to discuss Effexor  Please set this up for patient  Thanks!!  LG

## 2018-11-20 ENCOUNTER — Encounter: Payer: Self-pay | Admitting: Family Medicine

## 2018-11-20 ENCOUNTER — Ambulatory Visit (INDEPENDENT_AMBULATORY_CARE_PROVIDER_SITE_OTHER): Payer: BLUE CROSS/BLUE SHIELD | Admitting: Family Medicine

## 2018-11-20 ENCOUNTER — Other Ambulatory Visit: Payer: Self-pay

## 2018-11-20 DIAGNOSIS — F419 Anxiety disorder, unspecified: Secondary | ICD-10-CM

## 2018-11-20 DIAGNOSIS — F32A Depression, unspecified: Secondary | ICD-10-CM

## 2018-11-20 DIAGNOSIS — F329 Major depressive disorder, single episode, unspecified: Secondary | ICD-10-CM

## 2018-11-20 MED ORDER — VENLAFAXINE HCL ER 150 MG PO CP24: 150.0000 mg | ORAL_CAPSULE | Freq: Every day | ORAL | 1 refills | Status: DC

## 2018-11-20 NOTE — Progress Notes (Signed)
Patient ID: Courtney Schultz, female   DOB: 12-21-1965, 53 y.o.   MRN: [id number] Virtual Visit via Video Note  I connected with Courtney Schultz on 11/20/18 at  8:20 AM EDT by a video enabled telemedicine application and verified that I am speaking with the correct person using two identifiers. Location patient: home Location provider: [place] Persons participating in the virtual visit: patient, provider  I discussed the limitations of evaluation and management by telemedicine and the availability of in person appointments. The patient expressed understanding and agreed to proceed.   HPI:  Patient and I connected via video to discuss patient's anxiety and depression and possibly resuming her Effexor.  Patient states she has noticed over the past couple of months she has felt more unmotivated, more irritable and has noticed herself not being able to concentrate on tasks around her home as well.  States the reason she decided to call and get this appointment set up was she started to notice herself even getting upset with her pet dog and pet cats for doing normal cat and dog things.  Patient had previously been on 150 mg of Effexor in the past and did notice an overall good effect with that.  She denies any SI or HI.  She is working from home, has always work from home even prior to the novel coronavirus pandemic.  Patient states she does have enough food to not have to leave her house for at least the next 2 or 3 weeks as she grows vegetables in her own garden and has a freezer stocked with different foods.  Patient has been also keeping herself entertained on the property by going for walks on her [place] acres, trying to spend time with her animals and doing chores around the home.  Denies any chest pain, denies cough, denies shortness of breath or wheezing, denies fever, denies body aches, denies GI or GU issues.   ROS: See pertinent positives and negatives per HPI.  Patient Active  Problem List   Diagnosis Date Noted  . Encounter for screening colonoscopy   . Anxiety and depression 10/17/2015  . ADHD (attention deficit hyperactivity disorder) 01/30/2013  . Acute upper respiratory infection 01/30/2009  . PEPTIC ULCER DISEASE 10/02/2007  . SORE THROAT 06/27/2007  . 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 BUNIONECTOMY    . COLONOSCOPY WITH PROPOFOL N/A 08/31/2018   Procedure: COLONOSCOPY WITH PROPOFOL;  Surgeon: Lin Landsman, MD;  Location: [facility] ENDOSCOPY;  Service: Gastroenterology;  Laterality: N/A;    Family History  Problem Relation Age of Onset  . Breast cancer Mother 71  . Diabetes Father   . Hypertension Father    Social History   Tobacco Use  . Smoking status: Former Smoker    Types: Cigarettes  . Smokeless tobacco: Never Used  Substance Use Topics  . Alcohol use: Yes    Alcohol/week: 7.0 standard drinks    Types: 7 Cans of beer per week    Current Outpatient Medications:  .  EPINEPHrine 0.3 mg/0.3 mL IJ SOAJ injection, Inject 0.3 mLs (0.3 mg total) into the muscle as needed for anaphylaxis., Disp: 1 Device, Rfl: 5  EXAM:  VITALS per patient if applicable:  GENERAL: alert, oriented, appears well and in no acute distress  HEENT: atraumatic, conjunttiva clear, no obvious abnormalities on inspection of external nose and ears  NECK: normal movements of the head and neck  LUNGS: on inspection no signs of  respiratory distress, breathing rate appears normal, no obvious gross SOB, gasping or wheezing  CV: no obvious cyanosis  MS: moves all visible extremities without noticeable abnormality  PSYCH/NEURO: pleasant and cooperative, no obvious depression or anxiety, speech and thought processing grossly intact  ASSESSMENT AND PLAN:  Discussed the following assessment and plan:  Anxiety and depression   Patient will begin Effexor 150 mg daily.  Patient is aware that this medication can take 4 to 6  weeks to fully peak.  Encourage patient to continue trying to do things she enjoys around her home like going for walks with her animals, reading, doing exercise.  Patient also aware that many people are in [place] predicaments than her due to the novel coronavirus pandemic, so she has been using that as a way to remind herself to take time and reflect and enjoy the blessings she has in her life.   We will recheck the patient in approximately 6 weeks to follow-up on how the Effexor is going.   I discussed the assessment and treatment plan with the patient. The patient was provided an opportunity to ask questions and all were answered. The patient agreed with the plan and demonstrated an understanding of the instructions.   The patient was advised to call back or seek an in-person evaluation if the symptoms worsen or if the condition fails to improve as anticipated.    [name], FNP

## 2018-11-20 NOTE — Telephone Encounter (Signed)
Please call to set up a visit in 6-8 weeks from today for follow up on Effexor  This can be a virtual visit  Thanks!  LG

## 2018-12-04 ENCOUNTER — Ambulatory Visit: Payer: BLUE CROSS/BLUE SHIELD | Admitting: Family Medicine

## 2018-12-12 ENCOUNTER — Encounter: Payer: Self-pay | Admitting: Family Medicine

## 2018-12-12 NOTE — Telephone Encounter (Signed)
Can you see what time she wants to do virtual tomorrow for tick bite? Wednesday 4/29  Thanks!  LG

## 2018-12-13 ENCOUNTER — Ambulatory Visit (INDEPENDENT_AMBULATORY_CARE_PROVIDER_SITE_OTHER): Payer: BLUE CROSS/BLUE SHIELD | Admitting: Family Medicine

## 2018-12-13 ENCOUNTER — Encounter: Payer: Self-pay | Admitting: Family Medicine

## 2018-12-13 ENCOUNTER — Telehealth: Payer: Self-pay | Admitting: Family Medicine

## 2018-12-13 ENCOUNTER — Other Ambulatory Visit: Payer: Self-pay

## 2018-12-13 DIAGNOSIS — T148XXA Other injury of unspecified body region, initial encounter: Secondary | ICD-10-CM | POA: Diagnosis not present

## 2018-12-13 DIAGNOSIS — W57XXXA Bitten or stung by nonvenomous insect and other nonvenomous arthropods, initial encounter: Secondary | ICD-10-CM

## 2018-12-13 MED ORDER — DOXYCYCLINE HYCLATE 100 MG PO TABS: 100.0000 mg | ORAL_TABLET | Freq: Two times a day (BID) | ORAL | 0 refills | Status: DC

## 2018-12-13 NOTE — Progress Notes (Signed)
Patient ID: Courtney Schultz, female   DOB: 31-May-1966, 53 y.o.   MRN: [id number]  Virtual Visit via video Note  This visit type was conducted due to national recommendations for restrictions regarding the COVID-19 pandemic (e.g. social distancing).  This format is felt to be most appropriate for this patient at this time.  All issues noted in this document were discussed and addressed.  No physical exam was performed (except for noted visual exam findings with Video Visits).   I connected with Letisia Schwalb on 12/13/18 at  8:20 AM EDT by a video enabled telemedicine application or telephone and verified that I am speaking with the correct person using two identifiers. Location patient: home Location provider: [facility] Persons participating in the virtual visit: patient, provider  I discussed the limitations, risks, security and privacy concerns of performing an evaluation and management service by video and the availability of in person appointments. I also discussed with the patient that there may be a patient responsible charge related to this service. The patient expressed understanding and agreed to proceed.  HPI:  Patient and I connected via video due to having 3 or 4 tick bites.  Patient states that she has 3 areas on her legs where she is pretty sure she had tick bites due to circular bruising.  Also felt a tick on her scalp and was able to itch the area and tick fell off quickly.  Does not see any bruising on scalp.  [place] with many ticks.   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  Patient denies fever or chills.  Denies body aches.  Denies respiratory symptoms.  Denies GI or GU issues.  ROS: See pertinent positives and negatives per HPI.  Past Surgical History:  Procedure Laterality Date  . BUNIONECTOMY    . COLONOSCOPY WITH PROPOFOL  N/A 08/31/2018   Procedure: COLONOSCOPY WITH PROPOFOL;  Surgeon: Lin Landsman, MD;  Location[facility]OSCOPY;  Service: Gastroenterology;  Laterality: N/A;    Family History  Problem Relation Age of Onset  . Breast cancer Mother 42  . Diabetes Father   . Hypertension Father    Social History   Tobacco Use  . Smoking status: Former Smoker    Types: Cigarettes  . Smokeless tobacco: Never Used  Substance Use Topics  . Alcohol use: Yes    Alcohol/week: 7.0 standard drinks    Types: 7 Cans of beer per week    Current Outpatient Medications:  .  EPINEPHrine 0.3 mg/0.3 mL IJ SOAJ injection, Inject 0.3 mLs (0.3 mg total) into the muscle as needed for anaphylaxis., Disp: 1 Device, Rfl: 5 .  venlafaxine XR (EFFEXOR-XR) 150 MG 24 hr capsule, Take 1 capsule (150 mg total) by mouth daily with breakfast., Disp: 90 capsule, Rfl: 1 .  doxycycline (VIBRA-TABS) 100 MG tablet, Take 1 tablet (100 mg total) by mouth 2 (two) times daily., Disp: 20 tablet, Rfl: 0  EXAM:  GENERAL: alert, oriented, appears well and in no acute distress  HEENT: atraumatic, conjunttiva clear, no obvious abnormalities on inspection of external nose and ears  NECK: normal movements of the head and neck  LUNGS: on inspection no signs of respiratory distress, breathing rate appears normal, no obvious gross SOB, gasping or wheezing  CV: no obvious cyanosis  Skin: Patient was able to send me photographs of  the bruised areas on her skin via my chart.  Do appear to be circular in shape  MS: moves all visible extremities without noticeable abnormality  PSYCH/NEURO: pleasant and cooperative, no obvious depression or anxiety, speech and thought processing grossly intact  ASSESSMENT AND PLAN:  Discussed the following assessment and plan:  Tick bite, initial encounter - Plan: [place] spotted fvr abs pnl(IgG+IgM), Lyme Ab/Western Blot Reflex, doxycycline (VIBRA-TABS) 100 MG tablet   Due to patient falling multiple  ticks off of her and having surrounding bruising, we will err on side of caution and treat with doxycycline twice daily for 10 days.  Patient will come in the office in approximately 2 weeks from now for lab work to draw antibody titers for Lyme with Western blot reflex and also for [facility] spotted fever.  Patient advised to wear bug spray when outdoors and always check body and clothing after being outdoors to be sure she does not miss any ticks that potentially have attached themselves onto her skin.   I discussed the assessment and treatment plan with the patient. The patient was provided an opportunity to ask questions and all were answered. The patient agreed with the plan and demonstrated an understanding of the instructions.   The patient was advised to call back or seek an in-person evaluation if the symptoms worsen or if the condition fails to improve as anticipated.  Lab appt for 2 weeks from now scheduled.   Courtney Green, FNP

## 2018-12-13 NOTE — Telephone Encounter (Signed)
Pt scheduled this morning at 8:20am

## 2018-12-13 NOTE — Telephone Encounter (Signed)
Lab appt. Scheduled.

## 2018-12-13 NOTE — Telephone Encounter (Signed)
Please schedule her for labs on 12/27/2018 at 2 pm  Future lab orders in

## 2018-12-27 ENCOUNTER — Other Ambulatory Visit (INDEPENDENT_AMBULATORY_CARE_PROVIDER_SITE_OTHER): Payer: BLUE CROSS/BLUE SHIELD

## 2018-12-27 ENCOUNTER — Other Ambulatory Visit: Payer: Self-pay

## 2018-12-27 DIAGNOSIS — S0006XA Insect bite (nonvenomous) of scalp, initial encounter: Secondary | ICD-10-CM | POA: Diagnosis not present

## 2018-12-27 DIAGNOSIS — W57XXXA Bitten or stung by nonvenomous insect and other nonvenomous arthropods, initial encounter: Secondary | ICD-10-CM | POA: Diagnosis not present

## 2018-12-27 NOTE — Addendum Note (Signed)
Addended by: Leeanne Rio on: 12/27/2018 01:55 PM   Modules accepted: Orders

## 2018-12-29 LAB — LYME AB/WESTERN BLOT REFLEX
LYME DISEASE AB, QUANT, IGM: <0.8 index (ref 0.00–0.79)
Lyme IgG/IgM Ab: <0.91 {ISR} (ref 0.00–0.90)

## 2018-12-29 LAB — ROCKY MTN SPOTTED FVR ABS PNL(IGG+IGM)
RMSF IgG: POSITIVE — AB
RMSF IgM: 1.21 index — ABNORMAL HIGH (ref 0.00–0.89)

## 2018-12-29 LAB — RMSF, IGG, IFA: RMSF, IGG, IFA: <1:64 {titer}

## 2019-01-01 ENCOUNTER — Encounter: Payer: Self-pay | Admitting: Family Medicine

## 2019-01-01 ENCOUNTER — Other Ambulatory Visit: Payer: Self-pay | Admitting: Family Medicine

## 2019-01-01 DIAGNOSIS — W57XXXA Bitten or stung by nonvenomous insect and other nonvenomous arthropods, initial encounter: Secondary | ICD-10-CM

## 2019-01-01 MED ORDER — DOXYCYCLINE HYCLATE 100 MG PO TABS: 100.0000 mg | ORAL_TABLET | Freq: Two times a day (BID) | ORAL | 0 refills | Status: DC

## 2019-01-01 NOTE — Progress Notes (Signed)
Doxy rx due to +RMSF titer

## 2019-01-14 ENCOUNTER — Other Ambulatory Visit: Payer: Self-pay | Admitting: Family Medicine

## 2019-01-14 DIAGNOSIS — W57XXXA Bitten or stung by nonvenomous insect and other nonvenomous arthropods, initial encounter: Secondary | ICD-10-CM

## 2019-01-15 ENCOUNTER — Ambulatory Visit (INDEPENDENT_AMBULATORY_CARE_PROVIDER_SITE_OTHER): Payer: BLUE CROSS/BLUE SHIELD | Admitting: Family Medicine

## 2019-01-15 ENCOUNTER — Other Ambulatory Visit: Payer: Self-pay

## 2019-01-15 DIAGNOSIS — F329 Major depressive disorder, single episode, unspecified: Secondary | ICD-10-CM

## 2019-01-15 DIAGNOSIS — F419 Anxiety disorder, unspecified: Secondary | ICD-10-CM

## 2019-01-15 DIAGNOSIS — W57XXXD Bitten or stung by nonvenomous insect and other nonvenomous arthropods, subsequent encounter: Secondary | ICD-10-CM

## 2019-01-15 DIAGNOSIS — F32A Depression, unspecified: Secondary | ICD-10-CM

## 2019-01-15 NOTE — Progress Notes (Signed)
Patient ID: Courtney Schultz, female   DOB: 1966/03/27, 53 y.o.   MRN: [id number]    Virtual Visit via video  Note  This visit type was conducted due to national recommendations for restrictions regarding the COVID-19 pandemic (e.g. social distancing).  This format is felt to be most appropriate for this patient at this time.  All issues noted in this document were discussed and addressed.  No physical exam was performed (except for noted visual exam findings with Video Visits).   I connected with [name] today at  8:40 AM EDT by a video enabled telemedicine application and verified that I am speaking with the correct person using two identifiers. Location patient: home Location provider: [place] participating in the virtual visit: patient, provider  I discussed the limitations, risks, security and privacy concerns of performing an evaluation and management service by video and the availability of in person appointments. I also discussed with the patient that there may be a patient responsible charge related to this service. The patient expressed understanding and agreed to proceed.  HPI:  Patient and I connected via video to follow-up on how she is feeling after treatment with doxycycline after took by and titer showed positive [facility] spotted fever.  Overall patient states she is feeling well.  Her energy is getting back to normal and she has been out in her property tending to her bees and spending time with her animals.  Patient also is feeling well on her current Effexor dose.  Thinks overall her energy level and mood being better as a combination of feeling well from doxycycline course and also the Effexor peaking effect.  Denies fever chills.  Denies body aches.  Denies nausea, vomiting or diarrhea.  Denies cough.  Denies chest pain.  ROS: See pertinent positives and negatives per HPI.  Past Surgical History:  Procedure Laterality Date  . BUNIONECTOMY    .  COLONOSCOPY WITH PROPOFOL N/A 08/31/2018   Procedure: COLONOSCOPY WITH PROPOFOL;  Surgeon: Lin Landsman, MD;  Location: [facility] ENDOSCOPY;  Service: Gastroenterology;  Laterality: N/A;    Family History  Problem Relation Age of Onset  . Breast cancer Mother 29  . Diabetes Father   . Hypertension Father     Social History   Tobacco Use  . Smoking status: Former Smoker    Types: Cigarettes  . Smokeless tobacco: Never Used  Substance Use Topics  . Alcohol use: Yes    Alcohol/week: 7.0 standard drinks    Types: 7 Cans of beer per week   Social History   Tobacco Use  . Smoking status: Former Smoker    Types: Cigarettes  . Smokeless tobacco: Never Used  Substance Use Topics  . Alcohol use: Yes    Alcohol/week: 7.0 standard drinks    Types: 7 Cans of beer per week    Current Outpatient Medications:  .  doxycycline (VIBRA-TABS) 100 MG tablet, Take 1 tablet (100 mg total) by mouth 2 (two) times daily for 21 days., Disp: 42 tablet, Rfl: 0 .  EPINEPHrine 0.3 mg/0.3 mL IJ SOAJ injection, Inject 0.3 mLs (0.3 mg total) into the muscle as needed for anaphylaxis., Disp: 1 Device, Rfl: 5 .  venlafaxine XR (EFFEXOR-XR) 150 MG 24 hr capsule, Take 1 capsule (150 mg total) by mouth daily with breakfast., Disp: 90 capsule, Rfl: 1  EXAM:  GENERAL: alert, oriented, appears well and in no acute distress  HEENT: atraumatic, conjunttiva clear, no obvious abnormalities on inspection of external  nose and ears  NECK: normal movements of the head and neck  LUNGS: on inspection no signs of respiratory distress, breathing rate appears normal, no obvious gross SOB, gasping or wheezing  CV: no obvious cyanosis  MS: moves all visible extremities without noticeable abnormality  PSYCH/NEURO: pleasant and cooperative, no obvious depression or anxiety, speech and thought processing grossly intact  ASSESSMENT AND PLAN:  Discussed the following assessment and plan:  Tick bite, subsequent  encounter  Anxiety and depression  Patient will complete course of doxycycline as prescribed.  Advised that we can repeat a titer for [facility] spotted fever in the future if she would like, but is not required for treatment.  As long as she is feeling better that is a good indicator the treatment has taken effect.  Mood is stable on current Effexor dose and she will continue this.  Patient looking forward to the summer months, enjoys being outdoors.   I discussed the assessment and treatment plan with the patient. The patient was provided an opportunity to ask questions and all were answered. The patient agreed with the plan and demonstrated an understanding of the instructions.   The patient was advised to call back or seek an in-person evaluation if the symptoms worsen or if the condition fails to improve as anticipated.  She will otherwise follow-up in about 3 months for recheck on chronic medical conditions.  She is aware she can return to clinic sooner if any issues arise or she can call anytime with questions or concerns.  [name], FNP

## 2019-01-16 ENCOUNTER — Encounter: Payer: Self-pay | Admitting: Family Medicine

## 2019-04-13 ENCOUNTER — Other Ambulatory Visit: Payer: Self-pay

## 2019-04-13 ENCOUNTER — Ambulatory Visit (INDEPENDENT_AMBULATORY_CARE_PROVIDER_SITE_OTHER): Payer: BC Managed Care – PPO | Admitting: Family Medicine

## 2019-04-13 VITALS — BP 120/70 | HR 77 | Temp 98.2°F | Resp 18 | Ht 66.0 in | Wt 166.2 lb

## 2019-04-13 DIAGNOSIS — Z13220 Encounter for screening for lipoid disorders: Secondary | ICD-10-CM | POA: Diagnosis not present

## 2019-04-13 DIAGNOSIS — M25562 Pain in left knee: Secondary | ICD-10-CM | POA: Diagnosis not present

## 2019-04-13 DIAGNOSIS — Z23 Encounter for immunization: Secondary | ICD-10-CM

## 2019-04-13 DIAGNOSIS — F329 Major depressive disorder, single episode, unspecified: Secondary | ICD-10-CM

## 2019-04-13 DIAGNOSIS — T148XXD Other injury of unspecified body region, subsequent encounter: Secondary | ICD-10-CM | POA: Diagnosis not present

## 2019-04-13 DIAGNOSIS — F419 Anxiety disorder, unspecified: Secondary | ICD-10-CM

## 2019-04-13 DIAGNOSIS — W57XXXD Bitten or stung by nonvenomous insect and other nonvenomous arthropods, subsequent encounter: Secondary | ICD-10-CM

## 2019-04-13 DIAGNOSIS — F32A Depression, unspecified: Secondary | ICD-10-CM

## 2019-04-13 LAB — CBC
HCT: 39.8 % (ref 36.0–46.0)
Hemoglobin: 13.1 g/dL (ref 12.0–15.0)
MCHC: 33 g/dL (ref 30.0–36.0)
MCV: 93.2 fl (ref 78.0–100.0)
Platelets: 288 10*3/uL (ref 150.0–400.0)
RBC: 4.27 Mil/uL (ref 3.87–5.11)
RDW: 13.1 % (ref 11.5–15.5)
WBC: 6.3 10*3/uL (ref 4.0–10.5)

## 2019-04-13 LAB — COMPREHENSIVE METABOLIC PANEL
ALT: 12 U/L (ref 0–35)
AST: 20 U/L (ref 0–37)
Albumin: 4.4 g/dL (ref 3.5–5.2)
Alkaline Phosphatase: 61 U/L (ref 39–117)
BUN: 22 mg/dL (ref 6–23)
CO2: 26 mEq/L (ref 19–32)
Calcium: 9.6 mg/dL (ref 8.4–10.5)
Chloride: 102 mEq/L (ref 96–112)
Creatinine, Ser: 1 mg/dL (ref 0.40–1.20)
GFR: 58.3 mL/min — ABNORMAL LOW (ref >60.00)
Glucose, Bld: 87 mg/dL (ref 70–99)
Potassium: 4.4 mEq/L (ref 3.5–5.1)
Sodium: 137 mEq/L (ref 135–145)
Total Bilirubin: 0.5 mg/dL (ref 0.2–1.2)
Total Protein: 6.8 g/dL (ref 6.0–8.3)

## 2019-04-13 LAB — B12 AND FOLATE PANEL
Folate: 18.5 ng/mL (ref >5.9)
Vitamin B-12: 185 pg/mL — ABNORMAL LOW (ref 211–911)

## 2019-04-13 LAB — TSH: TSH: 1.04 u[IU]/mL (ref 0.35–4.50)

## 2019-04-13 LAB — LIPID PANEL
Cholesterol: 231 mg/dL — ABNORMAL HIGH (ref 0–200)
HDL: 87.8 mg/dL (ref >39.00)
LDL Cholesterol: 130 mg/dL — ABNORMAL HIGH (ref 0–99)
NonHDL: 142.84
Total CHOL/HDL Ratio: 3
Triglycerides: 66 mg/dL (ref 0.0–149.0)
VLDL: 13.2 mg/dL (ref 0.0–40.0)

## 2019-04-13 LAB — VITAMIN D 25 HYDROXY (VIT D DEFICIENCY, FRACTURES): VITD: 36.28 ng/mL (ref 30.00–100.00)

## 2019-04-13 MED ORDER — MELOXICAM 7.5 MG PO TABS: 7.5000 mg | ORAL_TABLET | Freq: Every day | ORAL | 3 refills | Status: AC | PRN

## 2019-04-13 MED ORDER — VENLAFAXINE HCL ER 150 MG PO CP24: 150.0000 mg | ORAL_CAPSULE | Freq: Every day | ORAL | 1 refills | Status: AC

## 2019-04-13 NOTE — Progress Notes (Signed)
Subjective:    Patient ID: Courtney Schultz, female    DOB: Jul 20, 1966, 53 y.o.   MRN: [id number]  HPI   Patient presents to clinic for follow-up on anxiety depression while on Effexor and also would like repeat lab work to check on the antibody levels after treatment for [facility] spotted fever with doxycycline.  Also reports left knee pain at times.  Mood is stable on Effexor, feels good on current dose.  Denies being overly anxious or down or depressed.  Has been enjoying working from home and spending a lot of time on her land; being outdoors.  Completed doxycycline course without any adverse effects.  Overall seems to be feeling back to her normal.  Has had left knee pain off and on for years, notices it aches at times.  Tries to do exercises like biking or an elliptical rather than running to help knee joint.  Denies any known injury to the knee.  Patient Active Problem List   Diagnosis Date Noted  . Encounter for screening colonoscopy   . Anxiety and depression 10/17/2015  . ADHD (attention deficit hyperactivity disorder) 01/30/2013  . Acute upper respiratory infection 01/30/2009  . PEPTIC ULCER DISEASE 10/02/2007  . SORE THROAT 06/27/2007  . 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 Smoking status: Former Smoker    Types: Cigarettes  . Smokeless tobacco: Never Used  Substance Use Topics  . Alcohol use: Yes    Alcohol/week: 7.0 standard drinks    Types: 7 Cans of beer per week    Review of Systems  Constitutional: Negative for chills, fatigue and fever.  HENT: Negative for congestion, ear pain, sinus pain and sore throat.   Eyes: Negative.   Respiratory: Negative for cough, shortness of breath and wheezing.   Cardiovascular: Negative for chest pain, palpitations and leg swelling.  Gastrointestinal: Negative for abdominal pain, diarrhea, nausea and vomiting.  Genitourinary: Negative for dysuria, frequency and urgency.  Musculoskeletal: +left knee  pain at times Skin: Negative for color change, pallor and rash.  Neurological: Negative for syncope, light-headedness and headaches.  Psychiatric/Behavioral: The patient is not nervous/anxious.       Objective:   Physical Exam Vitals signs and nursing note reviewed.  Constitutional:      General: She is not in acute distress.    Appearance: She is not toxic-appearing.  HENT:     Head: Normocephalic and atraumatic.  Eyes:     General: No scleral icterus.    Extraocular Movements: Extraocular movements intact.     Conjunctiva/sclera: Conjunctivae normal.     Pupils: Pupils are equal, round, and reactive to light.  Pulmonary:     Effort: Pulmonary effort is normal.     Breath sounds: Normal breath sounds.  Musculoskeletal:     Comments: Some tenderness at extreme limit of range in left knee  Skin:    General: Skin is warm and dry.     Coloration: Skin is not jaundiced or pale.  Neurological:     Mental Status: She is alert and oriented to person, place, and time.     Gait: Gait normal.  Psychiatric:        Mood and Affect: Mood normal.        Behavior: Behavior normal.        Thought Content: Thought content normal.        Judgment: Judgment normal.    Today's Vitals   04/13/19 1234  BP: 120/70  Pulse: 77  Resp: 18  Temp: 98.2 F (36.8 C)  SpO2: 98%  Weight: 166 lb 3.2 oz (75.4 kg)  Height: 5\' 6"  (1.676 m)   Body mass index is 26.83 kg/m.      Assessment & Plan:    Anxiety and depression-mood well controlled on Effexor, will continue this dose.  Left knee pain-appears to be chronic and suspicious for osteoarthritis.  She will use Mobic once daily as needed, advised to continue to do exercises that are not hard on the knee like biking or using the elliptical and also advised she can do topical rubs like BenGay or Biofreeze if needed.  [place] spotted fever/tick bite-we will get follow-up lab work  Lab work collected in clinic today  Flu vaccine given in  clinic today  Patient has planned complete physical exam appointment scheduled for October 2020

## 2019-04-18 LAB — ROCKY MTN SPOTTED FVR ABS PNL(IGG+IGM)
RMSF IgG: NOT DETECTED
RMSF IgM: NOT DETECTED

## 2019-05-16 ENCOUNTER — Ambulatory Visit (INDEPENDENT_AMBULATORY_CARE_PROVIDER_SITE_OTHER)
Admission: RE | Admit: 2019-05-16 | Discharge: 2019-05-16 | Disposition: A | Discharge status: Home / Self Care | Payer: BC Managed Care – PPO | Source: Ambulatory Visit

## 2019-05-16 DIAGNOSIS — R05 Cough: Secondary | ICD-10-CM

## 2019-05-16 DIAGNOSIS — R059 Cough, unspecified: Secondary | ICD-10-CM

## 2019-05-16 DIAGNOSIS — R0981 Nasal congestion: Secondary | ICD-10-CM | POA: Diagnosis not present

## 2019-05-16 DIAGNOSIS — R070 Pain in throat: Secondary | ICD-10-CM | POA: Diagnosis not present

## 2019-05-16 DIAGNOSIS — R51 Headache: Secondary | ICD-10-CM

## 2019-05-16 NOTE — ED Provider Notes (Signed)
Virtual Visit via Video Note:  Courtney Schultz  initiated request for Telemedicine visit with [facility] team. I connected with Courtney Schultz  on 05/16/2019 at 9:31 AM  for a synchronized telemedicine visit using a video enabled HIPPA compliant telemedicine application. I verified that I am speaking with Courtney Schultz  using two identifiers. Jaynee Eagles, PA-C  was physically located in a [facility] site and Courtney Schultz was located at a different location.   The limitations of evaluation and management by telemedicine as well as the availability of in-person appointments were discussed. Patient was informed that she  may incur a bill ( including co-pay) for this virtual visit encounter. Courtney Schultz  expressed understanding and gave verbal consent to proceed with virtual visit.   History of Present Illness:Courtney Schultz  is a 53 y.o. female presents with 3 day history of throat pain, stuffy nose. Has tried salt water gargles, ibuprofen. Has had occasional mild headache, occasional cough. Throat pain is now resolved.    Review of Systems  Constitutional: Negative for fever and malaise/fatigue.  HENT: Positive for congestion and sore throat. Negative for ear pain and sinus pain.   Eyes: Negative for blurred vision, double vision, discharge and redness.  Respiratory: Positive for cough. Negative for hemoptysis, shortness of breath and wheezing.   Cardiovascular: Negative for chest pain.  Gastrointestinal: Negative for abdominal pain, diarrhea, nausea and vomiting.  Genitourinary: Negative for dysuria, flank pain and hematuria.  Musculoskeletal: Negative for myalgias.  Skin: Negative for rash.  Neurological: Positive for headaches. Negative for dizziness and weakness.  Psychiatric/Behavioral: Negative for depression and substance abuse.    No current facility-administered medications for this encounter.    Current Outpatient Medications  Medication Sig  Dispense Refill  . EPINEPHrine 0.3 mg/0.3 mL IJ SOAJ injection Inject 0.3 mLs (0.3 mg total) into the muscle as needed for anaphylaxis. 1 Device 5  . meloxicam (MOBIC) 7.5 MG tablet Take 1 tablet (7.5 mg total) by mouth daily as needed for pain. 30 tablet 3  . venlafaxine XR (EFFEXOR-XR) 150 MG 24 hr capsule Take 1 capsule (150 mg total) by mouth daily with breakfast. 90 capsule 1     Allergies  Allergen Reactions  . Penicillins Anaphylaxis     No past medical history on file.  Past Surgical History:  Procedure Laterality Date  . BUNIONECTOMY    . COLONOSCOPY WITH PROPOFOL N/A 08/31/2018   Procedure: COLONOSCOPY WITH PROPOFOL;  Surgeon: Lin Landsman, MD;  Location: [facility] ENDOSCOPY;  Service: Gastroenterology;  Laterality: N/A;      Observations/Objective: Physical Exam Constitutional:      General: She is not in acute distress.    Appearance: Normal appearance. She is well-developed. She is not ill-appearing, toxic-appearing or diaphoretic.  Eyes:     Extraocular Movements: Extraocular movements intact.  Pulmonary:     Effort: Pulmonary effort is normal.  Neurological:     General: No focal deficit present.     Mental Status: She is alert and oriented to person, place, and time.  Psychiatric:        Mood and Affect: Mood normal.        Behavior: Behavior normal.        Thought Content: Thought content normal.        Judgment: Judgment normal.      Assessment and Plan:  1. Throat pain   2. Nasal congestion   3. Cough  Recommended supportive care. Low suspicion for COVID 19 given patient is social distancing. Discussed possibility of rhinitis due to weather or viral illness. Strep and COVID are possible and therefore recommended [facility] if she continues to have sx. Counseled patient on potential for adverse effects with medications prescribed/recommended today, ER and return-to-clinic precautions discussed, patient verbalized understanding.    Follow Up  Instructions:    I discussed the assessment and treatment plan with the patient. The patient was provided an opportunity to ask questions and all were answered. The patient agreed with the plan and demonstrated an understanding of the instructions.   The patient was advised to call back or seek an in-person evaluation if the symptoms worsen or if the condition fails to improve as anticipated.  I provided 11 minutes of non-face-to-face time during this encounter.    Jaynee Eagles, PA-C  05/16/2019 9:31 AM      Jaynee Eagles, PA-C 05/16/19 [phone]

## 2019-05-16 NOTE — Discharge Instructions (Addendum)
For sore throat or cough try using a honey-based tea. Use 3 teaspoons of honey with juice squeezed from half lemon. Place shaved pieces of ginger into 1/2-1 cup of water and warm over stove top. Then mix the ingredients and repeat every 4 hours as needed. Please take Tylenol 500mg  every 6 hours. Hydrate very well with at least 2 liters of water. Eat light meals such as soups to replenish electrolytes and soft fruits, veggies. Start an antihistamine like Zyrtec 10mg  once daily for postnasal drainage, sinus congestion.  You can take this together with pseudoephedrine (Sudafed) at a dose of 60 mg 3 times a day oral 120 mg twice daily as needed for the same kind of congestion.  Make sure you ask the pharmacist for this specifically.

## 2019-05-17 DIAGNOSIS — Z20828 Contact with and (suspected) exposure to other viral communicable diseases: Secondary | ICD-10-CM | POA: Diagnosis not present

## 2019-06-01 ENCOUNTER — Encounter: Payer: BLUE CROSS/BLUE SHIELD | Admitting: Family Medicine

## 2019-06-22 ENCOUNTER — Encounter: Payer: BLUE CROSS/BLUE SHIELD | Admitting: Family Medicine

## 2019-06-26 ENCOUNTER — Other Ambulatory Visit: Payer: Self-pay

## 2019-06-26 ENCOUNTER — Encounter: Payer: Self-pay | Admitting: Family Medicine

## 2019-06-26 ENCOUNTER — Ambulatory Visit (INDEPENDENT_AMBULATORY_CARE_PROVIDER_SITE_OTHER): Payer: BC Managed Care – PPO | Admitting: Family Medicine

## 2019-06-26 VITALS — BP 128/68 | HR 65 | Temp 96.8°F | Ht 66.25 in | Wt 165.0 lb

## 2019-06-26 DIAGNOSIS — Z1231 Encounter for screening mammogram for malignant neoplasm of breast: Secondary | ICD-10-CM

## 2019-06-26 DIAGNOSIS — Z0000 Encounter for general adult medical examination without abnormal findings: Secondary | ICD-10-CM

## 2019-06-26 NOTE — Progress Notes (Signed)
Subjective:    Patient ID: Courtney Schultz, female    DOB: 10/29/1965, 53 y.o.   MRN: [id number]  HPI   Patient presents to clinic for CPE.  Overall she is feeling well.  She has always worked from home, so her job did not change throughout the pandemic.  She enjoys her work.  She also enjoys spending time with her dogs.   Mammogram is due in December.  Pap smear was done last year.  Colonoscopy done in January 2020.  Sees eye doctor usually every 1 or 2 years and sees dentist every 6 months.  Mood is stable on current dose of Effexor.  Did drop down to every other day over the summertime, but has found herself being a little more cranky so has bumped self back up to daily dosing of 150.  Usually has a tougher time during the colder months due to it getting darker early.  Patient Active Problem List   Diagnosis Date Noted  . Encounter for screening colonoscopy   . Anxiety and depression 10/17/2015  . ADHD (attention deficit hyperactivity disorder) 01/30/2013  . Acute upper respiratory infection 01/30/2009  . PEPTIC ULCER DISEASE 10/02/2007  . SORE THROAT 06/27/2007  . 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 Smoking status: Former Smoker    Types: Cigarettes  . Smokeless tobacco: Never Used  Substance Use Topics  . Alcohol use: Yes    Alcohol/week: 7.0 standard drinks    Types: 7 Cans of beer per week   Family History  Problem Relation Age of Onset  . Breast cancer Mother 12  . Diabetes Father   . Hypertension Father    Past Surgical History:  Procedure Laterality Date  . BUNIONECTOMY    . COLONOSCOPY WITH PROPOFOL N/A 08/31/2018   Procedure: COLONOSCOPY WITH PROPOFOL;  Surgeon: Lin Landsman, MD;  Location: [facility] ENDOSCOPY;  Service: Gastroenterology;  Laterality: N/A;    Review of Systems  Constitutional: Negative for chills, fatigue and fever.  HENT: Negative for congestion, ear pain, sinus pain and sore throat.   Eyes: Negative.    Respiratory: Negative for cough, shortness of breath and wheezing.   Cardiovascular: Negative for chest pain, palpitations and leg swelling.  Gastrointestinal: Negative for abdominal pain, diarrhea, nausea and vomiting.  Genitourinary: Negative for dysuria, frequency and urgency.  Musculoskeletal: Negative for arthralgias and myalgias.  Skin: Negative for color change, pallor and rash.  Neurological: Negative for syncope, light-headedness and headaches.  Psychiatric/Behavioral: The patient is not nervous/anxious. No SI or HI      Objective:   Physical Exam Vitals signs and nursing note reviewed.  Constitutional:      General: She is not in acute distress.    Appearance: She is well-developed. She is not ill-appearing or toxic-appearing.  HENT:     Head: Normocephalic and atraumatic.     Right Ear: External ear normal.     Left Ear: External ear normal.     Nose: Nose normal.  Eyes:     General: No scleral icterus.       Right eye: No discharge.        Left eye: No discharge.     Extraocular Movements: Extraocular movements intact.     Conjunctiva/sclera: Conjunctivae normal.     Pupils: Pupils are equal, round, and reactive to light.  Neck:     Musculoskeletal: Normal range of motion and neck supple. No neck rigidity.     Trachea: No  tracheal deviation.  Cardiovascular:     Rate and Rhythm: Normal rate and regular rhythm.     Heart sounds: Normal heart sounds. No murmur. No friction rub. No gallop.   Pulmonary:     Effort: Pulmonary effort is normal. No respiratory distress.     Breath sounds: Normal breath sounds. No wheezing, rhonchi or rales.  Chest:     Chest wall: No tenderness.     Breasts:        Right: No swelling, bleeding, inverted nipple, mass, nipple discharge, skin change or tenderness.        Left: No swelling, bleeding, inverted nipple, mass, nipple discharge, skin change or tenderness.  Abdominal:     General: Bowel sounds are normal. There is no  distension.     Palpations: Abdomen is soft.     Tenderness: There is no abdominal tenderness. There is no guarding.  Musculoskeletal: Normal range of motion.        General: No deformity.  Lymphadenopathy:     Cervical: No cervical adenopathy.     Upper Body:     Right upper body: No supraclavicular, axillary or pectoral adenopathy.     Left upper body: No supraclavicular, axillary or pectoral adenopathy.  Skin:    General: Skin is warm and dry.     Capillary Refill: Capillary refill takes less than 2 seconds.     Coloration: Skin is not pale.     Findings: No erythema.  Neurological:     Mental Status: She is alert and oriented to person, place, and time.     Cranial Nerves: No cranial nerve deficit.  Psychiatric:        Behavior: Behavior normal.        Thought Content: Thought content normal.    Today's Vitals   06/26/19 1518  BP: 128/68  Pulse: 65  Temp: (!) 96.8 F (36 C)  TempSrc: Temporal  SpO2: 99%  Weight: 165 lb (74.8 kg)  Height: 5' 6.25" (1.683 m)   Body mass index is 26.43 kg/m.     Assessment & Plan:     Well adult exam-overall patient is a healthy-appearing 53 year old female.  We will follow up with a new B12 level due to it being slightly low previously.  She will get mammogram.  Continue Pap smear up-to-date.  Sees eye doctor and dentist regularly.  Always wears seatbelt in car.  Reviewed healthy diet and regular activity, recommended good water intake and balanced meals.  Also reviewed safe sun practices of SPF at least 30 when outdoors.  She will continue Effexor at current dose and let us know if anything is needed in regards to refill/dose changes.  Patient will follow-up annually for CPE in approximately every 4 to 6 months for general checkups while taking Effexor.

## 2019-06-27 LAB — VITAMIN B12: Vitamin B-12: 725 pg/mL (ref 211–911)

## 2019-09-04 DIAGNOSIS — M25561 Pain in right knee: Secondary | ICD-10-CM | POA: Diagnosis not present

## 2019-10-09 DIAGNOSIS — M25561 Pain in right knee: Secondary | ICD-10-CM | POA: Diagnosis not present

## 2019-10-10 IMAGING — MG DIGITAL SCREENING BILATERAL MAMMOGRAM WITH TOMO AND CAD
8 series · 9 of 24 positions shown · non-contrast
Comparison: Previous exam(s).

CLINICAL DATA: Screening.

EXAM:
DIGITAL SCREENING BILATERAL MAMMOGRAM WITH TOMO AND CAD

[L MLO synth-2D]
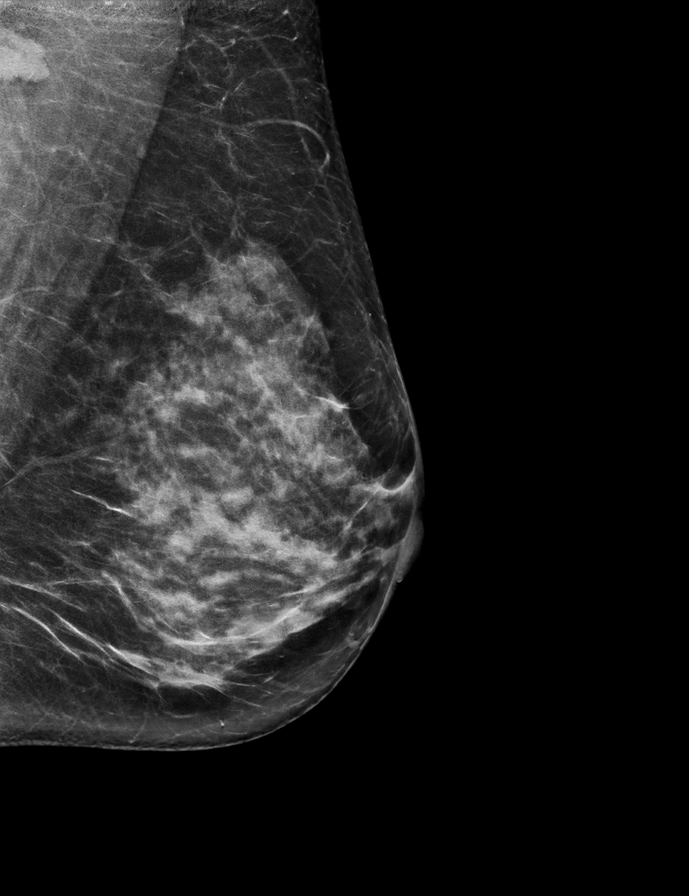

[R CC synth-2D]
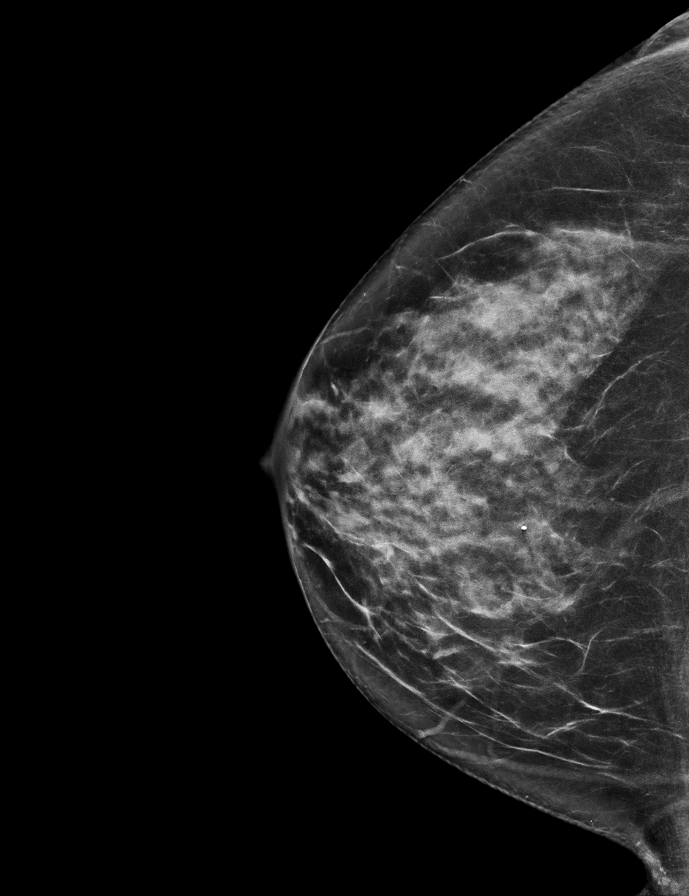

[R MLO synth-2D]
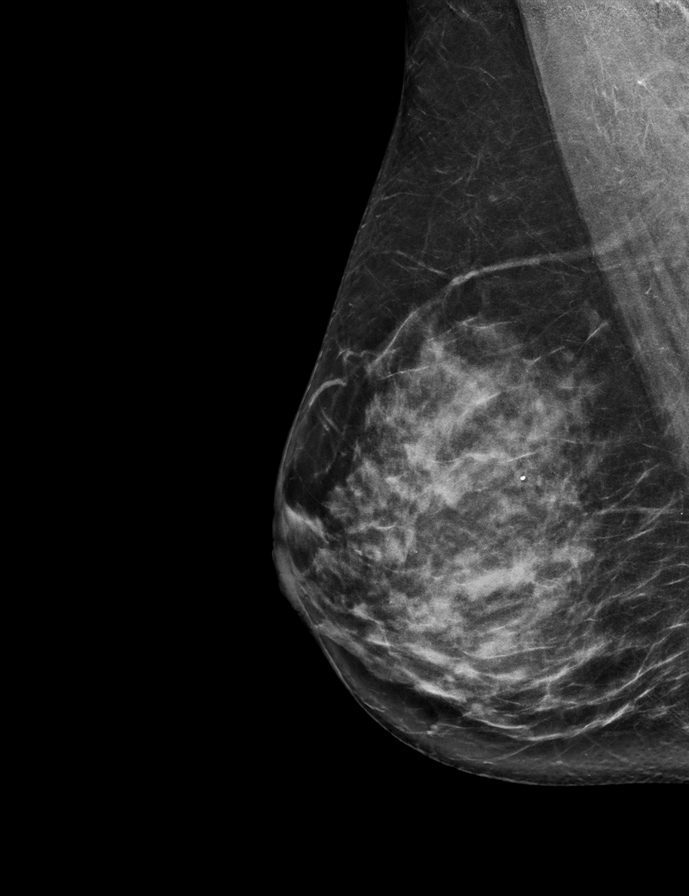

[L CC synth-2D]
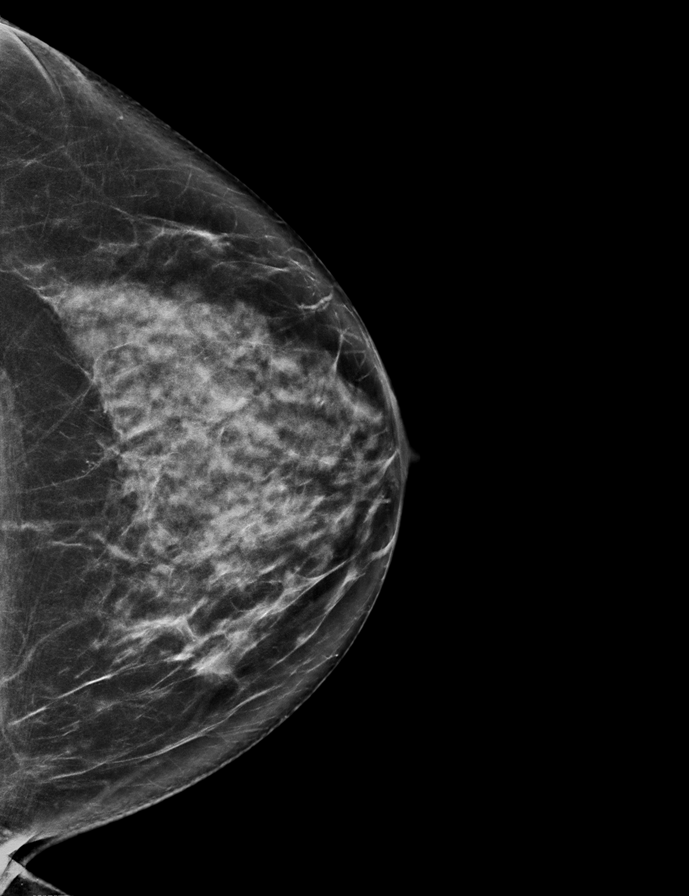

[L MLO tomo · 2 of 69 frames shown]
[frame 23/69]
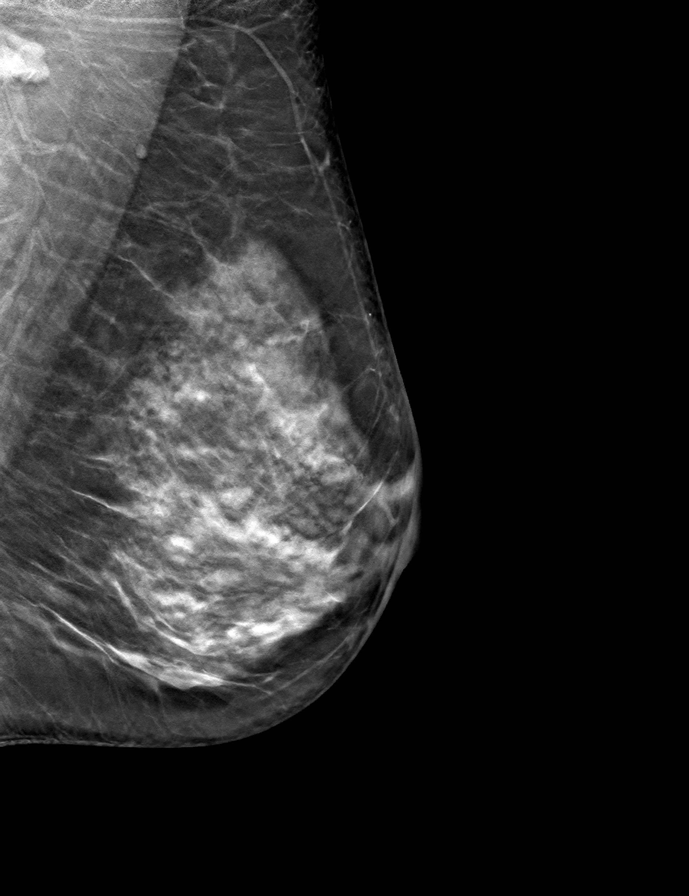
[frame 35/69]
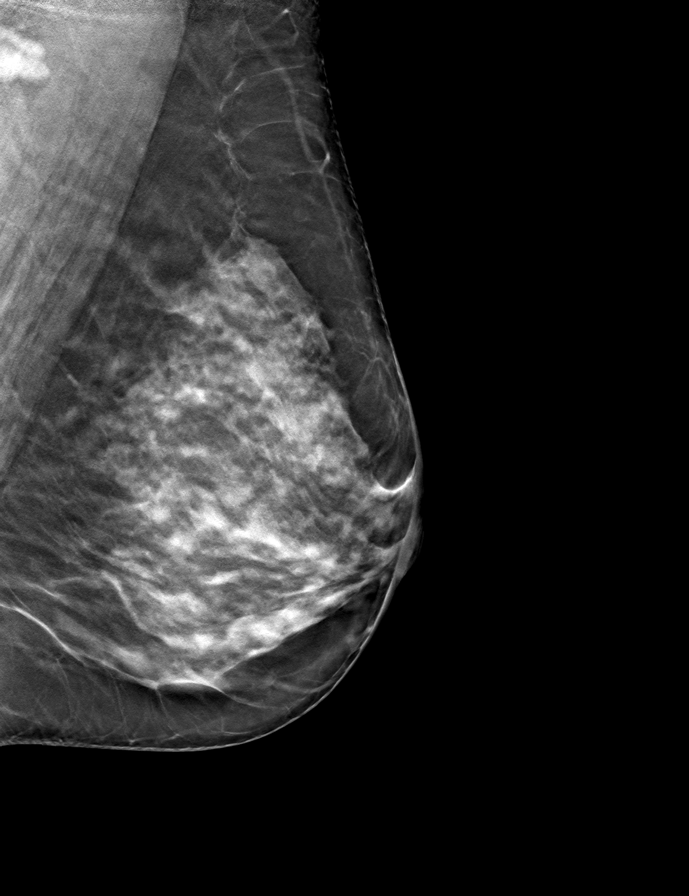

[R CC tomo · tomo slice 35/69.0]
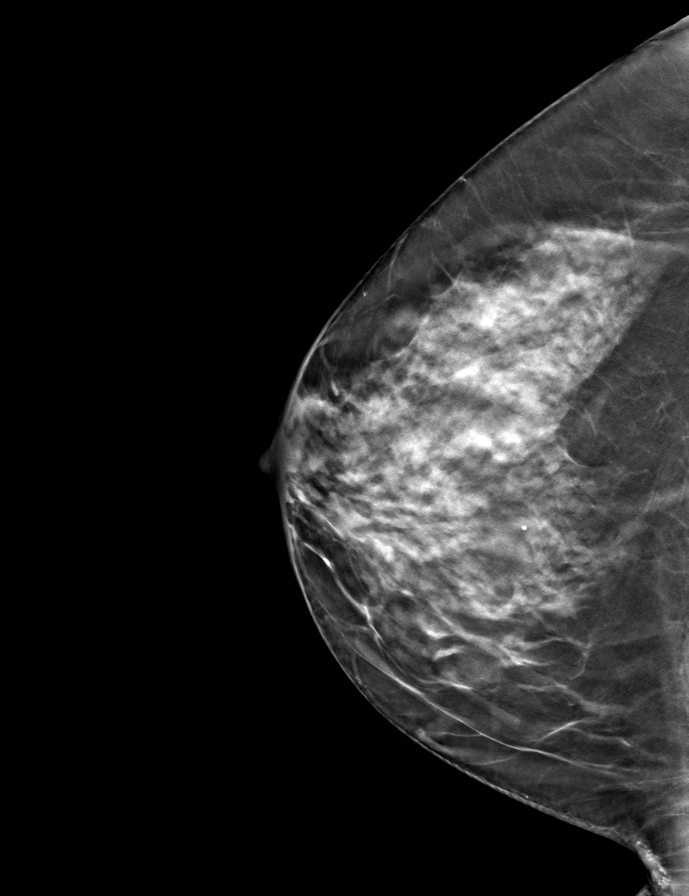

[L CC tomo · tomo slice 35/70.0]
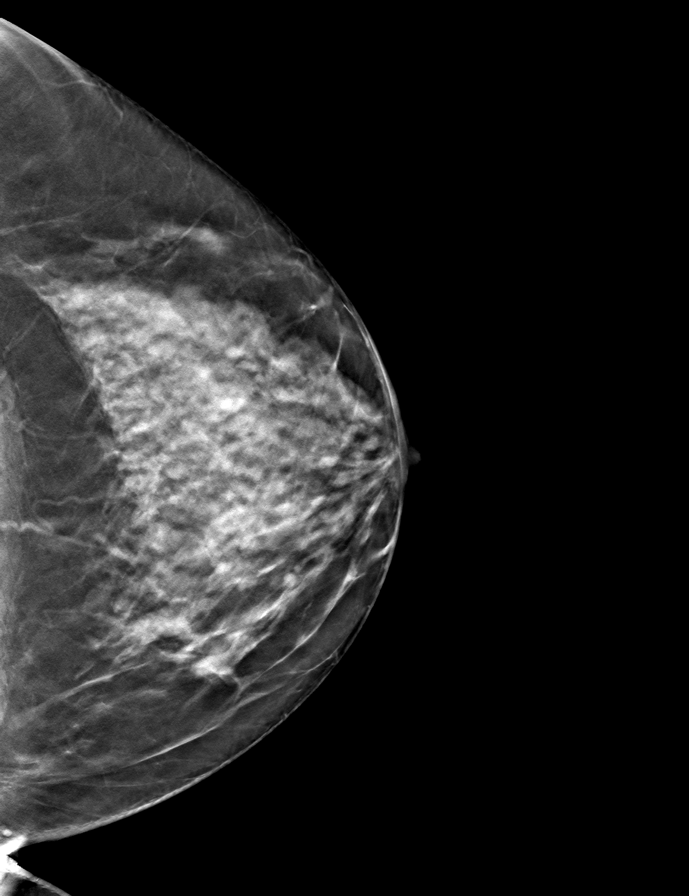

[R MLO tomo · tomo slice 37/72.0]
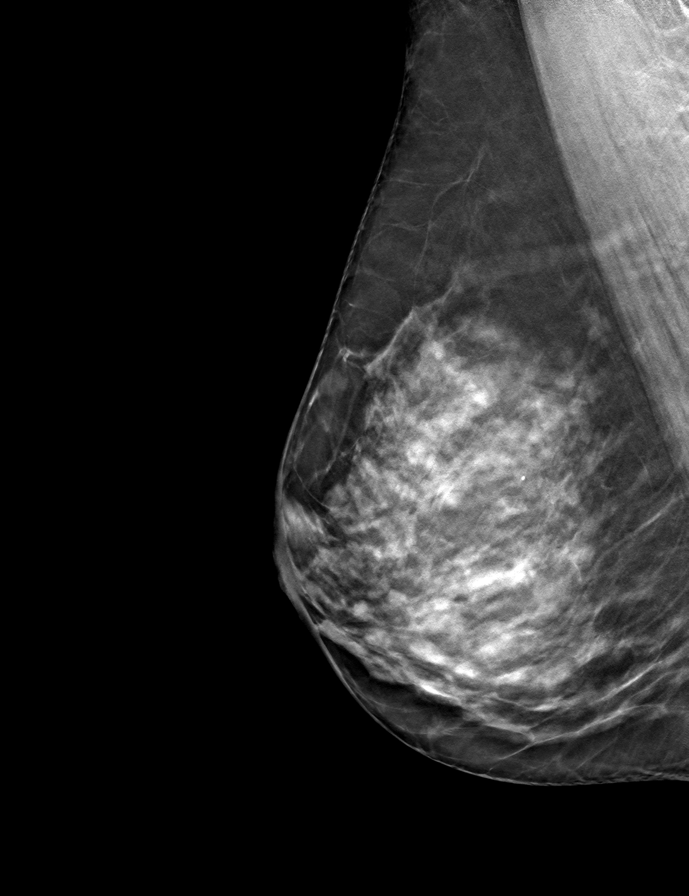

[9 of 24 positions shown; findings below may reference images not displayed]

ACR Breast Density Category c: The breast tissue is heterogeneously
dense, which may obscure small masses.
FINDINGS: There are no findings suspicious for malignancy. Images were
processed with CAD.
IMPRESSION: No mammographic evidence of malignancy. A result letter of this
screening mammogram will be mailed directly to the patient.

RECOMMENDATION:
Screening mammogram in one year. (Code:FT-U-LHB)

BI-RADS CATEGORY  1: Negative.

## 2019-10-17 ENCOUNTER — Encounter: Payer: Self-pay | Admitting: *Deleted

## 2019-12-18 DIAGNOSIS — Z20828 Contact with and (suspected) exposure to other viral communicable diseases: Secondary | ICD-10-CM | POA: Diagnosis not present

## 2019-12-18 DIAGNOSIS — Z03818 Encounter for observation for suspected exposure to other biological agents ruled out: Secondary | ICD-10-CM | POA: Diagnosis not present

## 2020-04-01 ENCOUNTER — Telehealth: Payer: BC Managed Care – PPO | Admitting: Nurse Practitioner

## 2020-04-01 DIAGNOSIS — N3000 Acute cystitis without hematuria: Secondary | ICD-10-CM | POA: Diagnosis not present

## 2020-04-01 MED ORDER — SULFAMETHOXAZOLE-TRIMETHOPRIM 800-160 MG PO TABS: 1.0000 | ORAL_TABLET | Freq: Two times a day (BID) | ORAL | 0 refills | Status: AC

## 2020-04-01 NOTE — Progress Notes (Signed)

## 2020-05-29 ENCOUNTER — Ambulatory Visit: Payer: Self-pay

## 2020-05-29 DIAGNOSIS — N3090 Cystitis, unspecified without hematuria: Secondary | ICD-10-CM | POA: Diagnosis not present

## 2020-05-31 ENCOUNTER — Other Ambulatory Visit: Payer: Self-pay

## 2020-05-31 ENCOUNTER — Ambulatory Visit
Admission: EM | Admit: 2020-05-31 | Discharge: 2020-05-31 | Disposition: A | Discharge status: Home / Self Care | Payer: BC Managed Care – PPO | Attending: Physician Assistant | Admitting: Physician Assistant

## 2020-05-31 ENCOUNTER — Encounter: Payer: Self-pay | Admitting: Emergency Medicine

## 2020-05-31 DIAGNOSIS — N3001 Acute cystitis with hematuria: Secondary | ICD-10-CM | POA: Insufficient documentation

## 2020-05-31 DIAGNOSIS — R300 Dysuria: Secondary | ICD-10-CM | POA: Insufficient documentation

## 2020-05-31 LAB — URINALYSIS, COMPLETE (UACMP) WITH MICROSCOPIC
Bilirubin Urine: NEGATIVE
Glucose, UA: NEGATIVE mg/dL
Ketones, ur: NEGATIVE mg/dL
Nitrite: NEGATIVE
Protein, ur: NEGATIVE mg/dL
Specific Gravity, Urine: 1.015 (ref 1.005–1.030)
WBC, UA: >50 WBC/hpf (ref 0–5)
pH: 5.5 (ref 5.0–8.0)

## 2020-05-31 MED ORDER — CIPROFLOXACIN HCL 500 MG PO TABS: 500.0000 mg | ORAL_TABLET | Freq: Two times a day (BID) | ORAL | 0 refills | Status: AC

## 2020-05-31 NOTE — ED Provider Notes (Signed)
[facility]    CSN: [id number] Arrival date & time: 05/31/20  1224      History   Chief Complaint Chief Complaint  Patient presents with  . Dysuria  . Urinary Frequency    HPI Courtney Schultz is a 54 y.o. female presenting for 4-day history of dysuria, urinary frequency and urgency.  She says that she was seen 2 days ago for telemedicine visit and prescribed Bactrim.  She says she felt better for about a day and then her symptoms returned.  Patient also admits to new left sided lower back pain over the past day or 2.  She denies any associated fever, chills, sweats, fatigue, abdominal/pelvic pain, vaginal discharge, concern for STIs.  She states that she saw some blood in the urine initially, but not recently.  Patient has been taking Bactrim DS but no other medications for symptoms.  No other complaints or concerns.  HPI  History reviewed. No pertinent past medical history.  Patient Active Problem List   Diagnosis Date Noted  . Encounter for screening colonoscopy   . Anxiety and depression 10/17/2015  . ADHD (attention deficit hyperactivity disorder) 01/30/2013  . Acute upper respiratory infection 01/30/2009  . PEPTIC ULCER DISEASE 10/02/2007  . SORE THROAT 06/27/2007  . 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 BUNIONECTOMY    . COLONOSCOPY WITH PROPOFOL N/A 08/31/2018   Procedure: COLONOSCOPY WITH PROPOFOL;  Surgeon: Lin Landsman, MD;  Location: [facility] ENDOSCOPY;  Service: Gastroenterology;  Laterality: N/A;    OB History   No obstetric history on file.      Home Medications    Prior to Admission medications   Medication Sig Start Date End Date Taking? Authorizing Provider  EPINEPHrine 0.3 mg/0.3 mL IJ SOAJ injection Inject 0.3 mLs (0.3 mg total) into the muscle as needed for anaphylaxis. 11/02/18  Yes Guse, Jacquelynn Cree, FNP  paragard intrauterine copper IUD IUD 1 each by Intrauterine route once.   Yes [provider]  sulfamethoxazole-trimethoprim (BACTRIM DS) 800-160 MG tablet Take 1 tablet by mouth 2 (two) times daily. 04/01/20  Yes Hassell Done, Mary-Margaret, FNP  ciprofloxacin (CIPRO) 500 MG tablet Take 1 tablet (500 mg total) by mouth every 12 (twelve) hours for 5 days. 05/31/20 06/05/20  Danton Clap, PA-C  meloxicam (MOBIC) 7.5 MG tablet Take 1 tablet (7.5 mg total) by mouth daily as needed for pain. 04/13/19   [name], FNP  venlafaxine XR (EFFEXOR-XR) 150 MG 24 hr capsule Take 1 capsule (150 mg total) by mouth daily with breakfast. 04/13/19   Guse, Jacquelynn Cree, FNP    Family History Family History  Problem Relation Age of Onset  . Breast cancer Mother 74  . Diabetes Father   . Hypertension Father     Social History Social History   Tobacco Use  . Smoking status: Former Smoker    Types: Cigarettes    Quit date: 06/01/2015    Years since quitting: 5.0  . Smokeless tobacco: Never Used  Vaping Use  . Vaping Use: Never used  Substance Use Topics  . Alcohol use: Yes    Alcohol/week: 7.0 standard drinks    Types: 7 Cans of beer per week  . Drug use: No     Allergies   Penicillin g and Penicillins   Review of Systems Review of Systems  Constitutional: Negative for fatigue and fever.  Gastrointestinal: Negative for abdominal pain, nausea and vomiting.  Genitourinary: Positive  for dysuria, frequency and urgency. Negative for flank pain, hematuria and vaginal discharge.  Musculoskeletal: Positive for back pain.  Skin: Negative for rash.     Physical Exam Triage Vital Signs ED Triage Vitals  Enc Vitals Group     BP 05/31/20 1232 (!) 143/91     Pulse Rate 05/31/20 1232 (!) 58     Resp 05/31/20 1232 18     Temp 05/31/20 1232 98 F (36.7 C)     Temp Source 05/31/20 1232 Oral     SpO2 05/31/20 1232 100 %     Weight 05/31/20 1233 165 lb (74.8 kg)     Height 05/31/20 1233 5\' 6"  (1.676 m)     Head Circumference --      Peak Flow --      Pain Score 05/31/20 1232 5       Pain Loc --      Pain Edu? --      Excl. in [place]? --    No data found.  Updated Vital Signs BP (!) 143/91 (BP Location: Left Arm)   Pulse (!) 58   Temp 98 F (36.7 C) (Oral)   Resp 18   Ht 5\' 6"  (1.676 m)   Wt 165 lb (74.8 kg)   SpO2 100%   BMI 26.63 kg/m       Physical Exam Vitals and nursing note reviewed.  Constitutional:      General: She is not in acute distress.    Appearance: Normal appearance. She is not ill-appearing or toxic-appearing.  HENT:     Head: Normocephalic and atraumatic.  Eyes:     General: No scleral icterus.       Right eye: No discharge.        Left eye: No discharge.     Conjunctiva/sclera: Conjunctivae normal.  Cardiovascular:     Rate and Rhythm: Regular rhythm. Bradycardia present.     Heart sounds: Normal heart sounds.  Pulmonary:     Effort: Pulmonary effort is normal. No respiratory distress.     Breath sounds: Normal breath sounds.  Abdominal:     Palpations: Abdomen is soft.     Tenderness: There is no abdominal tenderness. There is no right CVA tenderness, left CVA tenderness or guarding.  Musculoskeletal:     Cervical back: Neck supple.  Skin:    General: Skin is dry.  Neurological:     General: No focal deficit present.     Mental Status: She is alert. Mental status is at baseline.     Motor: No weakness.     Gait: Gait normal.  Psychiatric:        Mood and Affect: Mood normal.        Behavior: Behavior normal.        Thought Content: Thought content normal.      UC Treatments / Results  Labs (all labs ordered are listed, but only abnormal results are displayed) Labs Reviewed  URINALYSIS, COMPLETE (UACMP) WITH MICROSCOPIC - Abnormal; Notable for the following components:      Result Value   APPearance HAZY (*)    Hgb urine dipstick MODERATE (*)    Leukocytes,Ua MODERATE (*)    Bacteria, UA FEW (*)    All other components within normal limits  URINE CULTURE    EKG   Radiology No results  found.  Procedures Procedures (including critical care time)  Medications Ordered in UC Medications - No data to display  Initial Impression / Assessment and  Plan / UC Course  I have reviewed the triage vital signs and the nursing notes.  Pertinent labs & imaging results that were available during my care of the patient were reviewed by me and considered in my medical decision making (see chart for details).    Urinalysis positive for moderate blood and leukocytes.  Urine sent for culture at this time.  Advised stop Bactrim DS and begin Cipro.  Doubt pyelonephritis, but covered with Cipro just to be sure.  Patient is afebrile and no CVA tenderness on exam.  Advised increasing rest and fluid intake.  ED precautions discussed with patient.  Final Clinical Impressions(s) / UC Diagnoses   Final diagnoses:  Acute cystitis with hematuria  Dysuria     Discharge Instructions     UTI: Based on either symptoms or urinalysis, you may have a urinary tract infection. We will send the urine for culture and call with results in a few days. Begin antibiotics at this time. Your symptoms should be much improved over the next 2-3 days. Increase rest and fluid intake. If for some reason symptoms are worsening or not improving after a couple of days or the urine culture determines the antibiotics you are taking will not treat the infection, the antibiotics may be changed. Return or go to ER for fever, back pain, worsening urinary pain, discharge, increased blood in urine. May take Tylenol or Motrin OTC for pain relief or consider AZO if no contraindications    ED Prescriptions    Medication Sig Dispense Auth. Provider   ciprofloxacin (CIPRO) 500 MG tablet Take 1 tablet (500 mg total) by mouth every 12 (twelve) hours for 5 days. 10 tablet Gretta Cool     PDMP not reviewed this encounter.   Danton Clap, PA-C 05/31/20 1312

## 2020-05-31 NOTE — ED Triage Notes (Signed)
Patient in today c/o urinary frequency and dysuria since last night. Patient states she did an e-visit on 05/28/20 and prescribed Bactrim. Patient states she got better and all her symptoms resolved, but returned yesterday.

## 2020-05-31 NOTE — Discharge Instructions (Addendum)

## 2020-06-01 LAB — URINE CULTURE: Culture: >=100000 — AB

## 2020-06-02 LAB — URINE CULTURE

## 2020-06-13 ENCOUNTER — Encounter: Payer: Self-pay | Admitting: Urology

## 2020-06-13 ENCOUNTER — Other Ambulatory Visit: Payer: Self-pay

## 2020-06-13 ENCOUNTER — Ambulatory Visit (INDEPENDENT_AMBULATORY_CARE_PROVIDER_SITE_OTHER): Payer: BC Managed Care – PPO | Admitting: Urology

## 2020-06-13 VITALS — BP 126/81 | HR 70 | Ht 66.0 in | Wt 168.8 lb

## 2020-06-13 DIAGNOSIS — R310 Gross hematuria: Secondary | ICD-10-CM

## 2020-06-13 DIAGNOSIS — N390 Urinary tract infection, site not specified: Secondary | ICD-10-CM

## 2020-06-13 DIAGNOSIS — N3000 Acute cystitis without hematuria: Secondary | ICD-10-CM | POA: Diagnosis not present

## 2020-06-13 LAB — URINALYSIS, COMPLETE
Bilirubin, UA: NEGATIVE
Glucose, UA: NEGATIVE
Ketones, UA: NEGATIVE
Leukocytes,UA: NEGATIVE
Nitrite, UA: NEGATIVE
Protein,UA: NEGATIVE
RBC, UA: NEGATIVE
Specific Gravity, UA: 1.02 (ref 1.005–1.030)
Urobilinogen, Ur: 0.2 mg/dL (ref 0.2–1.0)
pH, UA: 5 (ref 5.0–7.5)

## 2020-06-13 LAB — MICROSCOPIC EXAMINATION: RBC, Urine: NONE SEEN /hpf (ref 0–2)

## 2020-06-13 LAB — BLADDER SCAN AMB NON-IMAGING

## 2020-06-13 MED ORDER — NITROFURANTOIN MONOHYD MACRO 100 MG PO CAPS: 100.0000 mg | ORAL_CAPSULE | ORAL | 1 refills | Status: AC | PRN

## 2020-06-13 NOTE — Progress Notes (Signed)
06/13/2020 8:47 AM   Reola Calkins Dec 21, 1965 [id number]  Referring provider: [name], FNP No address on file  CC: acute cystitis  HPI:  Aryella was referred for urinary tract infection.  She developed frequency urgency and dysuria. Started Aug 2021. Took Bactrim. Had another UTI and another episode Oct 2021. UA showed 6-10 red blood cells, greater than 50 white cells, culture grew E. Coli. Took another abx. Symptoms improving. Also had low back pain and gross hematuria (red urine) with these episodes. Might be PC.   No h/o stones. No pelvic surgery. No GU hx. Quit smoking 8 years ago.   She is a Financial planner.   PMH: No past medical history on file.  Surgical History: Past Surgical History:  Procedure Laterality Date  . BUNIONECTOMY    . COLONOSCOPY WITH PROPOFOL N/A 08/31/2018   Procedure: COLONOSCOPY WITH PROPOFOL;  Surgeon: Lin Landsman, MD;  Location: [facility] ENDOSCOPY;  Service: Gastroenterology;  Laterality: N/A;    Home Medications:  Allergies as of 06/13/2020      Reactions   Penicillin G Hives   Penicillins Anaphylaxis      Medication List       Accurate as of June 13, 2020  8:47 AM. If you have any questions, ask your nurse or doctor.        EPINEPHrine 0.3 mg/0.3 mL Soaj injection Commonly known as: EPI-PEN Inject 0.3 mLs (0.3 mg total) into the muscle as needed for anaphylaxis.   meloxicam 7.5 MG tablet Commonly known as: MOBIC Take 1 tablet (7.5 mg total) by mouth daily as needed for pain.   paragard intrauterine copper Iud IUD 1 each by Intrauterine route once.   sulfamethoxazole-trimethoprim 800-160 MG tablet Commonly known as: Bactrim DS Take 1 tablet by mouth 2 (two) times daily.   venlafaxine XR 150 MG 24 hr capsule Commonly known as: EFFEXOR-XR Take 1 capsule (150 mg total) by mouth daily with breakfast.       Allergies:  Allergies  Allergen Reactions  . Penicillin G Hives  . Penicillins Anaphylaxis     Family History: Family History  Problem Relation Age of Onset  . Breast cancer Mother 61  . Diabetes Father   . Hypertension Father     Social History:  reports that she quit smoking about 5 years ago. Her smoking use included cigarettes. She has never used smokeless tobacco. She reports current alcohol use of about 7.0 standard drinks of alcohol per week. She reports that she does not use drugs.   Physical Exam: BP 126/81 (BP Location: Left Arm, Patient Position: Sitting, Cuff Size: Normal)   Pulse 70   Ht 5\' 6"  (1.676 m)   Wt 168 lb 12.8 oz (76.6 kg)   BMI 27.25 kg/m   Constitutional:  Alert and oriented, No acute distress. HEENT: [place] AT, moist mucus membranes.  Trachea midline, no masses. Cardiovascular: No clubbing, cyanosis, or edema. Respiratory: Normal respiratory effort, no increased work of breathing. GI: Abdomen is soft, nontender, nondistended, no abdominal masses GU: No CVA tenderness Skin: No rashes, bruises or suspicious lesions. Neurologic: Grossly intact, no focal deficits, moving all 4 extremities. Psychiatric: Normal mood and affect.  Laboratory Data: Lab Results  Component Value Date   WBC 6.3 04/13/2019   HGB 13.1 04/13/2019   HCT 39.8 04/13/2019   MCV 93.2 04/13/2019   PLT 288.0 04/13/2019    Lab Results  Component Value Date   CREATININE 1.00 04/13/2019    No results found  for: PSA  No results found for: TESTOSTERONE  No results found for: HGBA1C  Urinalysis    Component Value Date/Time   COLORURINE YELLOW 05/31/2020 1237   APPEARANCEUR HAZY (A) 05/31/2020 1237   LABSPEC 1.015 05/31/2020 1237   PHURINE 5.5 05/31/2020 1237   GLUCOSEU NEGATIVE 05/31/2020 1237   HGBUR MODERATE (A) 05/31/2020 1237   HGBUR negative 09/25/2007 0937   BILIRUBINUR NEGATIVE 05/31/2020 1237   KETONESUR NEGATIVE 05/31/2020 1237   PROTEINUR NEGATIVE 05/31/2020 1237   UROBILINOGEN 0.2 09/25/2007 0937   NITRITE NEGATIVE 05/31/2020 1237   LEUKOCYTESUR  MODERATE (A) 05/31/2020 1237    Lab Results  Component Value Date   BACTERIA FEW (A) 05/31/2020    Pertinent Imaging: N/a  No results found for this or any previous visit.  No results found for this or any previous visit.  No results found for this or any previous visit.  No results found for this or any previous visit.  No results found for this or any previous visit.  No results found for this or any previous visit.  No results found for this or any previous visit.  No results found for this or any previous visit.   Assessment & Plan:    1. Recurrent UTI Complete abx. Drink water and take d-mannose.  - Urinalysis, Complete - Bladder Scan (Post Void Residual) in office  2. Gross hematuria - check CT scan and cystoscopy.   No follow-ups on file.  Festus Aloe, MD  [facility] [address], [place], [place] [address] [phone]

## 2020-06-13 NOTE — Patient Instructions (Signed)

## 2020-07-25 ENCOUNTER — Other Ambulatory Visit: Payer: Self-pay | Admitting: Urology

## 2020-09-01 ENCOUNTER — Encounter: Payer: Self-pay | Admitting: *Deleted

## 2020-09-01 NOTE — Progress Notes (Signed)
Sent pt a mychart message to notify her.

## 2020-10-13 DIAGNOSIS — S39012A Strain of muscle, fascia and tendon of lower back, initial encounter: Secondary | ICD-10-CM | POA: Diagnosis not present

## 2020-10-13 DIAGNOSIS — M545 Low back pain, unspecified: Secondary | ICD-10-CM | POA: Diagnosis not present

## 2020-10-13 DIAGNOSIS — M47816 Spondylosis without myelopathy or radiculopathy, lumbar region: Secondary | ICD-10-CM | POA: Diagnosis not present

## 2020-10-15 ENCOUNTER — Other Ambulatory Visit: Payer: Self-pay | Admitting: Obstetrics and Gynecology

## 2020-10-15 DIAGNOSIS — Z1331 Encounter for screening for depression: Secondary | ICD-10-CM | POA: Diagnosis not present

## 2020-10-15 DIAGNOSIS — Z01411 Encounter for gynecological examination (general) (routine) with abnormal findings: Secondary | ICD-10-CM | POA: Diagnosis not present

## 2020-10-15 DIAGNOSIS — Z30432 Encounter for removal of intrauterine contraceptive device: Secondary | ICD-10-CM | POA: Diagnosis not present

## 2020-10-15 DIAGNOSIS — Z1231 Encounter for screening mammogram for malignant neoplasm of breast: Secondary | ICD-10-CM

## 2020-10-15 DIAGNOSIS — F529 Unspecified sexual dysfunction not due to a substance or known physiological condition: Secondary | ICD-10-CM | POA: Diagnosis not present

## 2020-10-23 DIAGNOSIS — F529 Unspecified sexual dysfunction not due to a substance or known physiological condition: Secondary | ICD-10-CM | POA: Diagnosis not present

## 2020-10-30 DIAGNOSIS — F529 Unspecified sexual dysfunction not due to a substance or known physiological condition: Secondary | ICD-10-CM | POA: Diagnosis not present

## 2020-11-03 ENCOUNTER — Other Ambulatory Visit: Payer: Self-pay

## 2020-11-03 ENCOUNTER — Ambulatory Visit
Admission: RE | Admit: 2020-11-03 | Discharge: 2020-11-03 | Disposition: A | Discharge status: Home / Self Care | Payer: BC Managed Care – PPO | Source: Ambulatory Visit | Attending: Obstetrics and Gynecology | Admitting: Obstetrics and Gynecology

## 2020-11-03 DIAGNOSIS — Z1231 Encounter for screening mammogram for malignant neoplasm of breast: Secondary | ICD-10-CM | POA: Insufficient documentation

## 2020-11-20 DIAGNOSIS — F529 Unspecified sexual dysfunction not due to a substance or known physiological condition: Secondary | ICD-10-CM | POA: Diagnosis not present

## 2020-12-03 DIAGNOSIS — F529 Unspecified sexual dysfunction not due to a substance or known physiological condition: Secondary | ICD-10-CM | POA: Diagnosis not present

## 2020-12-08 DIAGNOSIS — K219 Gastro-esophageal reflux disease without esophagitis: Secondary | ICD-10-CM | POA: Diagnosis not present

## 2020-12-08 DIAGNOSIS — Z0000 Encounter for general adult medical examination without abnormal findings: Secondary | ICD-10-CM | POA: Diagnosis not present

## 2020-12-08 DIAGNOSIS — Z131 Encounter for screening for diabetes mellitus: Secondary | ICD-10-CM | POA: Diagnosis not present

## 2020-12-08 DIAGNOSIS — M546 Pain in thoracic spine: Secondary | ICD-10-CM | POA: Diagnosis not present

## 2020-12-08 DIAGNOSIS — F419 Anxiety disorder, unspecified: Secondary | ICD-10-CM | POA: Diagnosis not present

## 2020-12-08 DIAGNOSIS — Z13220 Encounter for screening for lipoid disorders: Secondary | ICD-10-CM | POA: Diagnosis not present

## 2020-12-08 DIAGNOSIS — F909 Attention-deficit hyperactivity disorder, unspecified type: Secondary | ICD-10-CM | POA: Diagnosis not present

## 2020-12-08 DIAGNOSIS — Z1389 Encounter for screening for other disorder: Secondary | ICD-10-CM | POA: Diagnosis not present

## 2021-01-01 DIAGNOSIS — F529 Unspecified sexual dysfunction not due to a substance or known physiological condition: Secondary | ICD-10-CM | POA: Diagnosis not present

## 2021-02-02 DIAGNOSIS — F529 Unspecified sexual dysfunction not due to a substance or known physiological condition: Secondary | ICD-10-CM | POA: Diagnosis not present

## 2021-03-02 DIAGNOSIS — F5222 Female sexual arousal disorder: Secondary | ICD-10-CM | POA: Diagnosis not present

## 2021-04-03 DIAGNOSIS — F5222 Female sexual arousal disorder: Secondary | ICD-10-CM | POA: Diagnosis not present

## 2021-04-14 DIAGNOSIS — Z1389 Encounter for screening for other disorder: Secondary | ICD-10-CM | POA: Diagnosis not present

## 2021-04-14 DIAGNOSIS — J029 Acute pharyngitis, unspecified: Secondary | ICD-10-CM | POA: Diagnosis not present

## 2021-05-01 DIAGNOSIS — F5222 Female sexual arousal disorder: Secondary | ICD-10-CM | POA: Diagnosis not present

## 2021-06-05 DIAGNOSIS — F5222 Female sexual arousal disorder: Secondary | ICD-10-CM | POA: Diagnosis not present

## 2021-07-02 DIAGNOSIS — F909 Attention-deficit hyperactivity disorder, unspecified type: Secondary | ICD-10-CM | POA: Diagnosis not present

## 2021-07-02 DIAGNOSIS — E7800 Pure hypercholesterolemia, unspecified: Secondary | ICD-10-CM | POA: Diagnosis not present

## 2021-07-02 DIAGNOSIS — Z0000 Encounter for general adult medical examination without abnormal findings: Secondary | ICD-10-CM | POA: Diagnosis not present

## 2021-07-02 DIAGNOSIS — F419 Anxiety disorder, unspecified: Secondary | ICD-10-CM | POA: Diagnosis not present

## 2021-07-02 DIAGNOSIS — F32A Depression, unspecified: Secondary | ICD-10-CM | POA: Diagnosis not present

## 2021-07-03 DIAGNOSIS — F5222 Female sexual arousal disorder: Secondary | ICD-10-CM | POA: Diagnosis not present

## 2022-09-26 ENCOUNTER — Ambulatory Visit
Admission: EM | Admit: 2022-09-26 | Discharge: 2022-09-26 | Disposition: A | Discharge status: Home / Self Care | Payer: No Typology Code available for payment source | Attending: Emergency Medicine | Admitting: Emergency Medicine

## 2022-09-26 DIAGNOSIS — J029 Acute pharyngitis, unspecified: Secondary | ICD-10-CM

## 2022-09-26 LAB — POCT RAPID STREP A (OFFICE): Rapid Strep A Screen: NEGATIVE

## 2022-09-26 MED ORDER — AZITHROMYCIN 250 MG PO TABS
250.0000 mg | ORAL_TABLET | Freq: Every day | ORAL | 0 refills | Status: AC
Start: 2022-09-26 — End: ?

## 2022-09-26 NOTE — ED Triage Notes (Signed)
Patient to Urgent Care with complaints of sore throat that started on Wednesday. Denies any known fevers.   Negative home covid test.

## 2022-09-26 NOTE — Discharge Instructions (Addendum)
Take the Zithromax as directed.  Follow up with your primary care provider if your symptoms are not improving.

## 2022-09-26 NOTE — ED Provider Notes (Signed)
Roderic Palau    CSN: [id number] Arrival date & time: 09/26/22  [id number]      History   Chief Complaint Chief Complaint  Patient presents with   Sore Throat    Entered by patient    HPI Courtney Schultz is a 57 y.o. female.  Patient presents with 5-day history of sore throat.  No fever, rash, cough, shortness of breath, vomiting, diarrhea, or other symptoms.  No OTC medications taken today.  Negative COVID test at home this morning.  The history is provided by the patient and medical records.    History reviewed. No pertinent past medical history.  Patient Active Problem List   Diagnosis Date Noted   Encounter for screening colonoscopy    Anxiety and depression 10/17/2015   ADHD (attention deficit hyperactivity disorder) 01/30/2013   Acute upper respiratory infection 01/30/2009   PEPTIC ULCER DISEASE 10/02/2007   SORE THROAT 06/27/2007   URTICARIA 06/27/2007    Past Surgical History:  Procedure Laterality Date   BUNIONECTOMY     COLONOSCOPY WITH PROPOFOL N/A 08/31/2018   Procedure: COLONOSCOPY WITH PROPOFOL;  Surgeon: Lin Landsman, MD;  Location: [facility];  Service: Gastroenterology;  Laterality: N/A;    OB History   No obstetric history on file.      Home Medications    Prior to Admission medications   Medication Sig Start Date End Date Taking? Authorizing Provider  azithromycin (ZITHROMAX) 250 MG tablet Take 1 tablet (250 mg total) by mouth daily. Take first 2 tablets together, then 1 every day until finished. 09/26/22  Yes Sharion Balloon, NP  EPINEPHrine 0.3 mg/0.3 mL IJ SOAJ injection Inject 0.3 mLs (0.3 mg total) into the muscle as needed for anaphylaxis. 11/02/18   [name], FNP  meloxicam (MOBIC) 7.5 MG tablet Take 1 tablet (7.5 mg total) by mouth daily as needed for pain. Patient not taking: Reported on 06/13/2020 04/13/19   [name], FNP  nitrofurantoin, macrocrystal-monohydrate, (MACROBID) 100 MG capsule Take 1 capsule (100 mg  total) by mouth as needed. Take one capsule after sexual activity Patient not taking: Reported on 09/26/2022 06/13/20   Festus Aloe, MD  paragard intrauterine copper IUD IUD 1 each by Intrauterine route once. Patient not taking: Reported on 09/26/2022    [provider]  sulfamethoxazole-trimethoprim (BACTRIM DS) 800-160 MG tablet Take 1 tablet by mouth 2 (two) times daily. Patient not taking: Reported on 06/13/2020 04/01/20   Chevis Pretty, FNP  venlafaxine XR (EFFEXOR-XR) 150 MG 24 hr capsule Take 1 capsule (150 mg total) by mouth daily with breakfast. Patient not taking: Reported on 06/13/2020 04/13/19   [name], FNP    Family History Family History  Problem Relation Age of Onset   Breast cancer Mother 19   Diabetes Father    Hypertension Father     Social History Social History   Tobacco Use   Smoking status: Former    Types: Cigarettes    Quit date: 06/01/2015    Years since quitting: 7.3   Smokeless tobacco: Never  Vaping Use   Vaping Use: Never used  Substance Use Topics   Alcohol use: Yes    Alcohol/week: 7.0 standard drinks of alcohol    Types: 7 Cans of beer per week   Drug use: No     Allergies   Penicillin g and Penicillins   Review of Systems Review of Systems  Constitutional:  Negative for chills and fever.  HENT:  Positive for  sore throat. Negative for ear pain.   Respiratory:  Negative for cough and shortness of breath.   Cardiovascular:  Negative for chest pain and palpitations.  Gastrointestinal:  Negative for diarrhea and vomiting.  Skin:  Negative for color change and rash.  All other systems reviewed and are negative.    Physical Exam Triage Vital Signs ED Triage Vitals  Enc Vitals Group     BP 09/26/22 1056 131/78     Pulse Rate 09/26/22 1051 68     Resp 09/26/22 1051 18     Temp 09/26/22 1051 98 F (36.7 C)     Temp src --      SpO2 09/26/22 1051 97 %     Weight --      Height --      Head Circumference  --      Peak Flow --      Pain Score 09/26/22 1054 2     Pain Loc --      Pain Edu? --      Excl. in [place]? --    No data found.  Updated Vital Signs BP 131/78   Pulse 68   Temp 98 F (36.7 C)   Resp 18   SpO2 97%   Visual Acuity Right Eye Distance:   Left Eye Distance:   Bilateral Distance:    Right Eye Near:   Left Eye Near:    Bilateral Near:     Physical Exam Vitals and nursing note reviewed.  Constitutional:      General: She is not in acute distress.    Appearance: Normal appearance. She is well-developed. She is not ill-appearing.  HENT:     Head: Normocephalic and atraumatic.     Right Ear: Tympanic membrane normal.     Left Ear: Tympanic membrane normal.     Mouth/Throat:     Mouth: Mucous membranes are moist.     Pharynx: Oropharyngeal exudate and posterior oropharyngeal erythema present.     Tonsils: 2+ on the right. 2+ on the left.  Cardiovascular:     Rate and Rhythm: Normal rate and regular rhythm.     Heart sounds: Normal heart sounds.  Pulmonary:     Effort: Pulmonary effort is normal. No respiratory distress.     Breath sounds: Normal breath sounds.  Musculoskeletal:     Cervical back: Neck supple.  Skin:    General: Skin is warm and dry.  Neurological:     Mental Status: She is alert.  Psychiatric:        Mood and Affect: Mood normal.        Behavior: Behavior normal.      UC Treatments / Results  Labs (all labs ordered are listed, but only abnormal results are displayed) Labs Reviewed  POCT RAPID STREP A (OFFICE)    EKG   Radiology No results found.  Procedures Procedures (including critical care time)  Medications Ordered in UC Medications - No data to display  Initial Impression / Assessment and Plan / UC Course  I have reviewed the triage vital signs and the nursing notes.  Pertinent labs & imaging results that were available during my care of the patient were reviewed by me and considered in my medical decision making  (see chart for details).    Acute pharyngitis.  Rapid strep negative but based on exam and length of symptoms, treating with Zithromax.  Discussed symptomatic treatment including Tylenol, rest, hydration.  Instructed patient to follow up with  her PCP if she is not improving.  She agrees to plan of care.   Final Clinical Impressions(s) / UC Diagnoses   Final diagnoses:  Acute pharyngitis, unspecified etiology     Discharge Instructions      Take the Zithromax as directed.  Follow up with your primary care provider if your symptoms are not improving.        ED Prescriptions     Medication Sig Dispense Auth. Provider   azithromycin (ZITHROMAX) 250 MG tablet Take 1 tablet (250 mg total) by mouth daily. Take first 2 tablets together, then 1 every day until finished. 6 tablet Sharion Balloon, NP      PDMP not reviewed this encounter.   Sharion Balloon, NP 09/26/22 1123
